# Patient Record
Sex: Female | Born: 1950 | Hispanic: Yes | Smoking: Never smoker
Health system: Southern US, Community
[De-identification: ages and names within clinical notes are randomized; demographics above are authoritative.]

## PROBLEM LIST (undated history)

## (undated) DIAGNOSIS — I10 Essential (primary) hypertension: Secondary | ICD-10-CM

## (undated) DIAGNOSIS — E119 Type 2 diabetes mellitus without complications: Secondary | ICD-10-CM

## (undated) DIAGNOSIS — F32A Depression, unspecified: Secondary | ICD-10-CM

## (undated) DIAGNOSIS — F329 Major depressive disorder, single episode, unspecified: Secondary | ICD-10-CM

---

## 1992-09-24 HISTORY — PX: UMBILICAL HERNIA REPAIR: SHX2598

## 2006-04-09 ENCOUNTER — Emergency Department: Payer: Self-pay | Admitting: Emergency Medicine

## 2007-07-14 ENCOUNTER — Emergency Department: Payer: Self-pay | Admitting: Emergency Medicine

## 2008-05-02 ENCOUNTER — Other Ambulatory Visit: Payer: Self-pay

## 2008-05-02 ENCOUNTER — Observation Stay: Payer: Self-pay | Admitting: Internal Medicine

## 2009-01-26 ENCOUNTER — Emergency Department: Payer: Self-pay | Admitting: Emergency Medicine

## 2009-06-07 ENCOUNTER — Emergency Department: Payer: Self-pay | Admitting: Emergency Medicine

## 2009-07-08 ENCOUNTER — Emergency Department: Payer: Self-pay | Admitting: Unknown Physician Specialty

## 2009-07-18 ENCOUNTER — Ambulatory Visit: Payer: Self-pay

## 2010-10-04 ENCOUNTER — Ambulatory Visit: Payer: Self-pay

## 2010-10-19 ENCOUNTER — Ambulatory Visit: Payer: Self-pay

## 2011-06-21 ENCOUNTER — Emergency Department: Payer: Self-pay | Admitting: Emergency Medicine

## 2011-07-12 ENCOUNTER — Emergency Department: Payer: Self-pay | Admitting: Emergency Medicine

## 2012-04-24 LAB — HM DIABETES EYE EXAM

## 2012-08-23 ENCOUNTER — Emergency Department: Payer: Self-pay | Admitting: Unknown Physician Specialty

## 2012-08-24 LAB — BASIC METABOLIC PANEL
Anion Gap: 6 — ABNORMAL LOW (ref 7–16)
BUN: 14 mg/dL (ref 7–18)
Co2: 29 mmol/L (ref 21–32)
EGFR (African American): >60
EGFR (Non-African Amer.): >60
Glucose: 151 mg/dL — ABNORMAL HIGH (ref 65–99)
Osmolality: 283 (ref 275–301)
Potassium: 3.7 mmol/L (ref 3.5–5.1)
Sodium: 140 mmol/L (ref 136–145)

## 2012-08-24 LAB — CBC
MCH: 30.7 pg (ref 26.0–34.0)
MCHC: 34 g/dL (ref 32.0–36.0)
MCV: 90 fL (ref 80–100)
Platelet: 364 10*3/uL (ref 150–440)
RBC: 4.18 10*6/uL (ref 3.80–5.20)
WBC: 9.8 10*3/uL (ref 3.6–11.0)

## 2012-08-24 LAB — CK TOTAL AND CKMB (NOT AT ARMC)
CK, Total: 77 U/L (ref 21–215)
CK, Total: 88 U/L (ref 21–215)
CK-MB: 0.7 ng/mL (ref 0.5–3.6)

## 2012-08-24 LAB — LIPASE, BLOOD: Lipase: 141 U/L (ref 73–393)

## 2012-08-24 LAB — TROPONIN I
Troponin-I: <0.02 ng/mL
Troponin-I: <0.02 ng/mL

## 2013-01-06 ENCOUNTER — Ambulatory Visit: Payer: Self-pay

## 2013-02-23 ENCOUNTER — Emergency Department: Payer: Self-pay | Admitting: Emergency Medicine

## 2013-02-23 LAB — CBC
HCT: 39.5 % (ref 35.0–47.0)
HGB: 13.2 g/dL (ref 12.0–16.0)
MCH: 29.8 pg (ref 26.0–34.0)
Platelet: 393 10*3/uL (ref 150–440)
RBC: 4.45 10*6/uL (ref 3.80–5.20)
RDW: 14.2 % (ref 11.5–14.5)

## 2013-02-23 LAB — COMPREHENSIVE METABOLIC PANEL
Albumin: 3.6 g/dL (ref 3.4–5.0)
Alkaline Phosphatase: 73 U/L (ref 50–136)
BUN: 14 mg/dL (ref 7–18)
Bilirubin,Total: 0.3 mg/dL (ref 0.2–1.0)
Calcium, Total: 9.1 mg/dL (ref 8.5–10.1)
Chloride: 107 mmol/L (ref 98–107)
Co2: 30 mmol/L (ref 21–32)
EGFR (African American): >60
Glucose: 133 mg/dL — ABNORMAL HIGH (ref 65–99)
Osmolality: 282 (ref 275–301)
SGOT(AST): 19 U/L (ref 15–37)
SGPT (ALT): 20 U/L (ref 12–78)

## 2013-02-23 LAB — URINALYSIS, COMPLETE
Blood: NEGATIVE
Glucose,UR: NEGATIVE mg/dL (ref 0–75)
Ketone: NEGATIVE
Ph: 5 (ref 4.5–8.0)
Protein: NEGATIVE
RBC,UR: 1 /HPF (ref 0–5)
Squamous Epithelial: 3

## 2013-05-07 LAB — HM DIABETES EYE EXAM

## 2014-10-23 ENCOUNTER — Emergency Department (HOSPITAL_COMMUNITY)
Admission: EM | Admit: 2014-10-23 | Discharge: 2014-10-24 | Disposition: A | Discharge status: Home / Self Care | Payer: Self-pay | Attending: Emergency Medicine | Admitting: Emergency Medicine

## 2014-10-23 ENCOUNTER — Encounter (HOSPITAL_COMMUNITY): Payer: Self-pay | Admitting: *Deleted

## 2014-10-23 ENCOUNTER — Emergency Department (HOSPITAL_COMMUNITY): Payer: Self-pay

## 2014-10-23 DIAGNOSIS — R079 Chest pain, unspecified: Secondary | ICD-10-CM | POA: Insufficient documentation

## 2014-10-23 DIAGNOSIS — E119 Type 2 diabetes mellitus without complications: Secondary | ICD-10-CM | POA: Insufficient documentation

## 2014-10-23 DIAGNOSIS — Z7982 Long term (current) use of aspirin: Secondary | ICD-10-CM | POA: Insufficient documentation

## 2014-10-23 DIAGNOSIS — Z79899 Other long term (current) drug therapy: Secondary | ICD-10-CM | POA: Insufficient documentation

## 2014-10-23 DIAGNOSIS — I10 Essential (primary) hypertension: Secondary | ICD-10-CM | POA: Insufficient documentation

## 2014-10-23 HISTORY — DX: Essential (primary) hypertension: I10

## 2014-10-23 HISTORY — DX: Type 2 diabetes mellitus without complications: E11.9

## 2014-10-23 LAB — BASIC METABOLIC PANEL
Anion gap: 5 (ref 5–15)
BUN: 13 mg/dL (ref 6–23)
CHLORIDE: 107 mmol/L (ref 96–112)
CO2: 29 mmol/L (ref 19–32)
CREATININE: 0.78 mg/dL (ref 0.50–1.10)
Calcium: 9.1 mg/dL (ref 8.4–10.5)
GFR calc non Af Amer: 88 mL/min — ABNORMAL LOW (ref >90)
Glucose, Bld: 156 mg/dL — ABNORMAL HIGH (ref 70–99)
POTASSIUM: 3.5 mmol/L (ref 3.5–5.1)
SODIUM: 141 mmol/L (ref 135–145)

## 2014-10-23 LAB — TROPONIN I: Troponin I: <0.03 ng/mL (ref <0.031)

## 2014-10-23 LAB — CBC
HEMATOCRIT: 38.2 % (ref 36.0–46.0)
Hemoglobin: 12.5 g/dL (ref 12.0–15.0)
MCH: 29.3 pg (ref 26.0–34.0)
MCHC: 32.7 g/dL (ref 30.0–36.0)
MCV: 89.5 fL (ref 78.0–100.0)
PLATELETS: 370 10*3/uL (ref 150–400)
RBC: 4.27 MIL/uL (ref 3.87–5.11)
RDW: 14 % (ref 11.5–15.5)
WBC: 6.2 10*3/uL (ref 4.0–10.5)

## 2014-10-23 LAB — BRAIN NATRIURETIC PEPTIDE: B NATRIURETIC PEPTIDE 5: 37.2 pg/mL (ref 0.0–100.0)

## 2014-10-23 MED ORDER — ASPIRIN 81 MG PO CHEW
324.0000 mg | CHEWABLE_TABLET | Freq: Once | ORAL | Status: AC
  Administered 2014-10-23: 324 mg via ORAL
  Filled 2014-10-23: qty 4

## 2014-10-23 MED ORDER — OMEPRAZOLE 20 MG PO CPDR: 20.0000 mg | DELAYED_RELEASE_CAPSULE | Freq: Every day | ORAL | Status: DC

## 2014-10-23 MED ORDER — MORPHINE SULFATE 4 MG/ML IJ SOLN
4.0000 mg | Freq: Once | INTRAMUSCULAR | Status: AC
  Administered 2014-10-23: 4 mg via INTRAVENOUS
  Filled 2014-10-23: qty 1

## 2014-10-23 NOTE — Discharge Instructions (Signed)
Dolor de pecho (no específico) °(Chest Pain (Nonspecific)) °Con frecuencia es difícil dar un diagnóstico específico de la causa del dolor de pecho. Siempre hay una posibilidad de que el dolor podría estar relacionado con algo grave, como un ataque al corazón o un coágulo sanguíneo en los pulmones. Debe someterse a controles con el médico para más evaluaciones. °CAUSAS  °· Acidez. °· Neumonía o bronquitis. °· Ansiedad o estrés. °· Inflamación de la zona que rodea al corazón (pericarditis) o a los pulmones (pleuritis o pleuresía). °· Un coágulo sanguíneo en el pulmón. °· Colapso de un pulmón (neumotórax), que puede aparecer de manera repentina por sí solo (neumotórax espontáneo) o debido a un traumatismo en el tórax. °· Culebrilla (virus del herpes zóster). °La pared torácica está compuesta por huesos, músculos y cartílago. Cualquiera de estos puede ser la fuente del dolor. °· Puede haber una contusión en los huesos debido a una lesión. °· Puede haber un esguince en los músculos o el cartílago ocasionado por la tos o por trabajo excesivo. °· El cartílago puede verse afectado por una inflamación y provocar dolor (costocondritis). °DIAGNÓSTICO  °Quizás se necesiten análisis de laboratorio u otros estudios para encontrar la causa del dolor. Además, puede indicarle que se haga una prueba llamada electrocadiograma (ECG) ambulatorio. El ECG registra los patrones de los latidos cardíacos durante 24 horas. Además, pueden hacerle otros estudios, por ejemplo: °· Ecocardiograma transtorácico (ETT). Durante el ecocardiograma, se usan ondas sonoras para evaluar el flujo de la sangre a través del corazón. °· Ecocardiograma transesofágico (ETE). °· Monitoreo cardíaco. Permite que el médico controle la frecuencia y el ritmo cardíaco en tiempo real. °· Monitor Holter. Es un dispositivo portátil que registra los latidos cardíacos y ayuda a diagnosticar las arritmias cardíacas. Le permite al médico registrar la actividad cardíaca  durante varios días, si es necesario. °· Pruebas de estrés por ejercicio o por medicamentos que aceleran los latidos cardíacos. °TRATAMIENTO  °· El tratamiento depende de la causa del dolor de pecho. El tratamiento puede incluir: °¨ Inhibidores de la acidez estomacal. °¨ Antiinflamatorios. °¨ Analgésicos para las enfermedades inflamatorias. °¨ Antibióticos, si hay una infección. °· Podrán aconsejarle que modifique su estilo de vida. Esto incluye dejar de fumar y evitar el alcohol, la cafeína y el chocolate. °· Pueden aconsejarle que mantenga la cabeza levantada (elevada) cuando duerme. Esto reduce la probabilidad de que el ácido retroceda del estómago al esófago. °En la mayoría de los casos, el dolor de pecho no específico mejorará en el término de 2 a 3 días, con reposo y analgésicos suaves.  °INSTRUCCIONES PARA EL CUIDADO EN EL HOGAR  °· Si le prescriben antibióticos, tómelos tal como se le indicó. Termínelos aunque comience a sentirse mejor. °· Durante los días siguientes, no haga actividades físicas que provoquen dolor de pecho. Continúe con las actividades físicas tal como se le indicó °· No consuma ningún producto que contenga tabaco, incluidos cigarrillos, tabaco de mascar o cigarrillos electrónicos. °· Evite el consumo de alcohol. °· Tome los medicamentos solamente como se lo haya indicado el médico. °· Siga las sugerencias del médico en lo que respecta a las pruebas adicionales, si el dolor de pecho no desaparece. °· Concurra a todas las visitas de control programadas. Si no lo hace, podría desarrollar problemas permanentes (crónicos) relacionados con el dolor. Si hay algún problema para concurrir a una cita, llame para reprogramarla. °SOLICITE ATENCIÓN MÉDICA SI:  °· El dolor de pecho no desaparece, incluso después del tratamiento. °· Tiene una erupción cutánea con ampollas en el   pecho. °· Tiene fiebre. °SOLICITE ATENCIÓN MÉDICA DE INMEDIATO SI:  °· Aumenta el dolor de pecho o este se irradia hacia el  brazo, el cuello, la mandíbula, la espalda o el abdomen. °· Le falta el aire. °· La tos empeora, o expectora sangre. °· Siente dolor intenso en la espalda o el abdomen. °· Se siente nauseoso o vomita. °· Siente debilidad intensa. °· Se desmaya. °· Tiene escalofríos. °Esto es una emergencia. No espere a ver si el dolor se pasa. Obtenga ayuda médica de inmediato. Llame a los servicios de emergencia locales (911 en los [place]). No conduzca por sus propios medios hasta el hospital. °ASEGÚRESE DE QUE:  °· Comprende estas instrucciones. °· Controlará su afección. °· Recibirá ayuda de inmediato si no mejora o si empeora. °Document Released: 09/10/2005 Document Revised: 09/15/2013 °ExitCare® Patient Information ©2015 [employer]. This information is not intended to replace advice given to you by your health care provider. Make sure you discuss any questions you have with your health care provider. ° °

## 2014-10-23 NOTE — ED Provider Notes (Addendum)
CSN: [id number]     Arrival date & time 10/23/14  1855 History   First MD Initiated Contact with Patient 10/23/14 2117      Patient is a 64 y.o. female presenting with chest pain. The history is provided by the patient.  Chest Pain Pain location:  L chest Pain quality: sharp   Pain radiates to:  L arm Pain severity:  Moderate Timing:  Intermittent Progression:  Waxing and waning Chronicity:  New Relieved by:  Nothing Exacerbated by: not worse with exertion or positions. Associated symptoms: no abdominal pain, no fever, no nausea, no shortness of breath and not vomiting   Associated symptoms comment:  She did feel lightheaded and fatigued, she had tingling in her fingers and toes Over the past week she has had the pain.  It has never completely gone away.   It has waxed and waned in intensity but has been persistent She was seen at [facility] for a similar episode and was admitted.  She had some type of test (stress test?).  Did not have a heart cath.  She was told the symptoms were related to stress.  Past Medical History  Diagnosis Date  . Diabetes mellitus without complication   . Hypertension    History reviewed. No pertinent past surgical history. No family history on file. History  Substance Use Topics  . Smoking status: Never Smoker   . Smokeless tobacco: Not on file  . Alcohol Use: No   OB History    No data available     Review of Systems  Constitutional: Negative for fever.  Respiratory: Negative for shortness of breath.   Cardiovascular: Positive for chest pain.  Gastrointestinal: Negative for nausea, vomiting and abdominal pain.  All other systems reviewed and are negative.     Allergies  Review of patient's allergies indicates no known allergies.  Home Medications   Prior to Admission medications   Medication Sig Start Date End Date Taking? Authorizing Provider  aspirin 81 MG tablet Take 81 mg by mouth daily.   Yes Historical Provider, MD   glipiZIDE (GLUCOTROL) 10 MG tablet Take 10 mg by mouth 2 (two) times daily.   Yes Historical Provider, MD  metFORMIN (GLUCOPHAGE) 500 MG tablet Take 500 mg by mouth 2 (two) times daily with a meal.   Yes Historical Provider, MD  metoprolol tartrate (LOPRESSOR) 25 MG tablet Take 25 mg by mouth 2 (two) times daily.   Yes Historical Provider, MD  ranitidine (ZANTAC) 150 MG capsule Take 150 mg by mouth 2 (two) times daily.   Yes Historical Provider, MD  omeprazole (PRILOSEC) 20 MG capsule Take 1 capsule (20 mg total) by mouth daily. 10/23/14   Linwood DibblesJon Maloni Musleh, MD   BP 172/79 mmHg  Pulse 62  Temp(Src) 98 F (36.7 C)  Resp 19  SpO2 97% Physical Exam  Constitutional: She appears well-developed and well-nourished. No distress.  HENT:  Head: Normocephalic and atraumatic.  Right Ear: External ear normal.  Left Ear: External ear normal.  Eyes: Conjunctivae are normal. Right eye exhibits no discharge. Left eye exhibits no discharge. No scleral icterus.  Neck: Neck supple. No tracheal deviation present.  Cardiovascular: Normal rate, regular rhythm and normal heart sounds.   Pulmonary/Chest: Effort normal and breath sounds normal. No stridor. No respiratory distress. She has no wheezes.  Abdominal: Soft. There is no tenderness.  Musculoskeletal: She exhibits no edema.  Neurological: She is alert. Cranial nerve deficit: no gross deficits.  Skin: Skin is warm and  dry. No rash noted.  Psychiatric: She has a normal mood and affect.  Nursing note and vitals reviewed.   ED Course  Procedures (including critical care time) Labs Review Labs Reviewed  BASIC METABOLIC PANEL - Abnormal; Notable for the following:    Glucose, Bld 156 (*)    GFR calc non Af Amer 88 (*)    All other components within normal limits  CBC  BRAIN NATRIURETIC PEPTIDE  TROPONIN I  TROPONIN I    Imaging Review Dg Chest 2 View  10/23/2014   CLINICAL DATA:  Left chest and left arm pain for 1 week which has worsened over the past  2 days.  EXAM: CHEST  2 VIEW  COMPARISON:  None.  FINDINGS: The lungs are clear. Heart size is normal. There is no pneumothorax pleural effusion. No focal bony abnormality.  IMPRESSION: No acute disease.   Electronically Signed   By: Drusilla Kanner M.D.   On: 10/23/2014 20:23     EKG Interpretation   Date/Time:  Saturday October 23 2014 19:02:56 EST Ventricular Rate:  75 PR Interval:  208 QRS Duration: 100 QT Interval:  388 QTC Calculation: 433 R Axis:   7 Text Interpretation:  Normal sinus rhythm Normal ECG No old tracing to  compare Confirmed by Shadaya Marschner  MD-J, Madelein Mahadeo ([id number]) on 10/23/2014 9:39:01 PM      MDM   Final diagnoses:  Chest pain, unspecified chest pain type    Heart Score of 3.   Pt's symptoms however have been persistent for an entire week.  2 sets of cardiac enzymes negative. History of similar symptoms in the past with a reportedly negative stress test. I doubt the symptoms are related to an acute coronary syndrome. I recommend follow-up with her cardiologist. Continue aspirin daily. We'll have her try a course of antacids to see if that offers any symptomatic relief. precautions were discussed.  Linwood Dibbles, MD 10/23/14 0865  Linwood Dibbles, MD 11/15/14 [phone]

## 2014-10-23 NOTE — ED Notes (Signed)
The pt is c/o lt upper chest pain   With lt arm radiation and numbness for 5-6 days.  The pt has rapid respirations..  C/o dizziness sob .  Similar episode 6 years ago.  No cardiac diagnosis

## 2014-11-15 NOTE — Progress Notes (Signed)
Erron  

## 2014-11-16 ENCOUNTER — Encounter: Payer: Self-pay | Admitting: Cardiovascular Disease

## 2014-11-25 ENCOUNTER — Telehealth: Payer: Self-pay | Admitting: Cardiovascular Disease

## 2014-11-25 ENCOUNTER — Encounter: Payer: Self-pay | Admitting: Cardiovascular Disease

## 2014-11-25 NOTE — Telephone Encounter (Signed)
ERROR

## 2014-12-26 ENCOUNTER — Emergency Department
Admit: 2014-12-26 | Disposition: A | Discharge status: Home / Self Care | Payer: Self-pay | Admitting: Physician Assistant

## 2015-03-01 ENCOUNTER — Ambulatory Visit: Payer: Self-pay

## 2015-07-05 ENCOUNTER — Ambulatory Visit: Payer: Self-pay

## 2015-07-07 ENCOUNTER — Ambulatory Visit: Payer: Self-pay | Admitting: Ophthalmology

## 2015-07-07 LAB — HM DIABETES EYE EXAM

## 2015-08-09 ENCOUNTER — Other Ambulatory Visit: Payer: Self-pay

## 2015-08-16 ENCOUNTER — Ambulatory Visit: Payer: Self-pay

## 2015-08-23 ENCOUNTER — Ambulatory Visit: Payer: Self-pay

## 2015-08-23 LAB — MICROALBUMIN, URINE: Microalb, Ur: 3

## 2015-11-07 ENCOUNTER — Emergency Department: Payer: Self-pay

## 2015-11-07 ENCOUNTER — Encounter: Payer: Self-pay | Admitting: Urgent Care

## 2015-11-07 ENCOUNTER — Emergency Department
Admission: EM | Admit: 2015-11-07 | Discharge: 2015-11-07 | Disposition: A | Discharge status: Home / Self Care | Payer: Self-pay | Attending: Emergency Medicine | Admitting: Emergency Medicine

## 2015-11-07 DIAGNOSIS — H9203 Otalgia, bilateral: Secondary | ICD-10-CM | POA: Insufficient documentation

## 2015-11-07 DIAGNOSIS — Z7982 Long term (current) use of aspirin: Secondary | ICD-10-CM | POA: Insufficient documentation

## 2015-11-07 DIAGNOSIS — Z79899 Other long term (current) drug therapy: Secondary | ICD-10-CM | POA: Insufficient documentation

## 2015-11-07 DIAGNOSIS — I10 Essential (primary) hypertension: Secondary | ICD-10-CM | POA: Insufficient documentation

## 2015-11-07 DIAGNOSIS — Z7984 Long term (current) use of oral hypoglycemic drugs: Secondary | ICD-10-CM | POA: Insufficient documentation

## 2015-11-07 DIAGNOSIS — E119 Type 2 diabetes mellitus without complications: Secondary | ICD-10-CM | POA: Insufficient documentation

## 2015-11-07 DIAGNOSIS — J069 Acute upper respiratory infection, unspecified: Secondary | ICD-10-CM | POA: Insufficient documentation

## 2015-11-07 MED ORDER — PSEUDOEPH-BROMPHEN-DM 30-2-10 MG/5ML PO SYRP: 10.0000 mL | ORAL_SOLUTION | Freq: Four times a day (QID) | ORAL | Status: DC | PRN

## 2015-11-07 MED ORDER — FLUTICASONE PROPIONATE 50 MCG/ACT NA SUSP: 2.0000 | Freq: Every day | NASAL | Status: DC

## 2015-11-07 NOTE — ED Provider Notes (Signed)
[facility] Emergency Department Provider Note  ____________________________________________  Time seen: Approximately 9:45 PM  I have reviewed the triage vital signs and the nursing notes.   HISTORY  Chief Complaint Cough; Sore Throat; and Otalgia    HPI Kelly Rose is a 65 y.o. female, NAD, sensory emergency department with 3 week history of cough, sore throat, ear pain, nasal congestion. Denies any wheezing, chest congestion, chest pain, back pain. No fevers, chills, body aches. Has not taken anything over-the-counter for current symptoms. No known sick contacts.   Past Medical History  Diagnosis Date  . Diabetes mellitus without complication (HCC)   . Hypertension     There are no active problems to display for this patient.   History reviewed. No pertinent past surgical history.  Current Outpatient Rx  Name  Route  Sig  Dispense  Refill  . aspirin 81 MG tablet   Oral   Take 81 mg by mouth daily.         . brompheniramine-pseudoephedrine-DM 30-2-10 MG/5ML syrup   Oral   Take 10 mLs by mouth 4 (four) times daily as needed.   200 mL   0   . fluticasone (FLONASE) 50 MCG/ACT nasal spray   Each Nare   Place 2 sprays into both nostrils daily.   16 g   0   . glipiZIDE (GLUCOTROL) 10 MG tablet   Oral   Take 10 mg by mouth 2 (two) times daily.         Marland Kitchen lisinopril (PRINIVIL,ZESTRIL) 20 MG tablet   Oral   Take 20 mg by mouth daily.         . metFORMIN (GLUCOPHAGE) 500 MG tablet   Oral   Take 500 mg by mouth 2 (two) times daily with a meal.         . metoprolol tartrate (LOPRESSOR) 25 MG tablet   Oral   Take 25 mg by mouth 2 (two) times daily.         Marland Kitchen omeprazole (PRILOSEC) 20 MG capsule   Oral   Take 1 capsule (20 mg total) by mouth daily.   30 capsule   0   . ranitidine (ZANTAC) 150 MG capsule   Oral   Take 150 mg by mouth 2 (two) times daily.         . simvastatin (ZOCOR) 20 MG tablet   Oral   Take 20 mg by  mouth daily.           Allergies Review of patient's allergies indicates no known allergies.  No family history on file.  Social History Social History  Substance Use Topics  . Smoking status: Never Smoker   . Smokeless tobacco: None  . Alcohol Use: No     Review of Systems  Constitutional: No fever/chills, fatigue.  Eyes:  No discharge ENT: Positive sore throat, bilateral ear pain. No nasal congestion, drainage, sinus pressure Cardiovascular: No chest pain. Respiratory: Negative cough. No shortness of breath. No wheezing. No chest congestion Musculoskeletal: Negative for back pain and general myalgias.  Skin: Negative for rash. Neurological: Negative for headaches, focal weakness or numbness. 10-point ROS otherwise negative.  ____________________________________________   PHYSICAL EXAM:  VITAL SIGNS: ED Triage Vitals  Enc Vitals Group     BP 11/07/15 2024 168/71 mmHg     Pulse Rate 11/07/15 2024 71     Resp 11/07/15 2024 18     Temp 11/07/15 2024 98 F (36.7 C)  Temp Source 11/07/15 2024 Oral     SpO2 11/07/15 2024 97 %     Weight 11/07/15 2024 240 lb (108.863 kg)     Height 11/07/15 2024  (1.702 m)     Head Cir --      Peak Flow --      Pain Score 11/07/15 2025 0     Pain Loc --      Pain Edu? --      Excl. in GC? --     Constitutional: Alert and oriented. Well appearing and in no acute distress. Eyes: Conjunctivae are normal. PERRL. EOMI without pain.  Head: Atraumatic. ENT:      Ears: Bilateral TMs visualized without perforation, erythema, bulging. Mild serous effusions noted bilaterally.      Nose: Moderate congestion with trace rhinnorhea.      Mouth/Throat: Mucous membranes are moist. Pharynx without erythema, swelling, exudates Neck: Supple with full range of motion Hematological/Lymphatic/Immunilogical: No cervical lymphadenopathy. Cardiovascular: Normal rate, regular rhythm. Normal S1 and S2.  Good peripheral  circulation. Respiratory: Normal respiratory effort without tachypnea or retractions. Lungs CTAB. Neurologic:  Normal speech and language. No gross focal neurologic deficits are appreciated.  Skin:  Skin is warm, dry and intact. No rash noted. Psychiatric: Mood and affect are normal. Speech and behavior are normal. Patient exhibits appropriate insight and judgement.   ____________________________________________   LABS  None  ____________________________________________  EKG  None ____________________________________________  RADIOLOGY I have personally viewed and evaluated these images (plain radiographs) as part of my medical decision making, as well as reviewing the written report by the radiologist.  Dg Chest 2 View  11/07/2015  CLINICAL DATA:  Patient has had a productive cough for 3 weeks. She has had weakness, headache, ear ache and sore throat Hx htn, diabetes EXAM: CHEST  2 VIEW COMPARISON:  10/23/2014 FINDINGS: Normal heart size and pulmonary vascularity. No focal airspace disease or consolidation in the lungs. No blunting of costophrenic angles. No pneumothorax. Mediastinal contours appear intact. Calcification of the aorta. Degenerative changes in the spine. IMPRESSION: No active cardiopulmonary disease. Electronically Signed   By: Burman Nieves M.D.   On: 11/07/2015 21:13    ____________________________________________    PROCEDURES  Procedure(s) performed: None   Medications - No data to display   ____________________________________________   INITIAL IMPRESSION / ASSESSMENT AND PLAN / ED COURSE  Patient's diagnosis is consistent with viral upper respiratory infection with lingering eustachian tube dysfunction. Patient will be discharged home with prescriptions for Flonase nasal spray and promethazine DM cough syrup to use as to rectal. Patient is to follow up with her care physician if symptoms persist past this treatment course. Patient is given ED  precautions to return to the ED for any worsening or new symptoms.    ____________________________________________  FINAL CLINICAL IMPRESSION(S) / ED DIAGNOSES  Final diagnoses:  Upper respiratory infection, viral      NEW MEDICATIONS STARTED DURING THIS VISIT:  New Prescriptions   BROMPHENIRAMINE-PSEUDOEPHEDRINE-DM 30-2-10 MG/5ML SYRUP    Take 10 mLs by mouth 4 (four) times daily as needed.   FLUTICASONE (FLONASE) 50 MCG/ACT NASAL SPRAY    Place 2 sprays into both nostrils daily.         Hope Pigeon, PA-C 11/07/15 2151  Sharman Cheek, MD 11/07/15 2253

## 2015-11-07 NOTE — Discharge Instructions (Signed)
Infección del tracto respiratorio superior, adultos  (Upper Respiratory Infection, Adult)  La mayoría de las infecciones del tracto respiratorio superior son infecciones virales de las vías que llevan el aire a los pulmones. Un infección del tracto respiratorio superior afecta la nariz, la garganta y las vías respiratorias superiores. El tipo más frecuente de infección del tracto respiratorio superior es la nasofaringitis, que habitualmente se conoce como "resfrío común".  Las infecciones del tracto respiratorio superior siguen su curso y por lo general se curan solas. En la mayoría de los casos, la infección del tracto respiratorio superior no requiere atención médica, pero a veces, después de una infección viral, puede surgir una infección bacteriana en las vías respiratorias superiores. Esto se conoce como infección secundaria. Las infecciones sinusales y en el oído medio son tipos frecuentes de infecciones secundarias en el tracto respiratorio superior.  La neumonía bacteriana también puede complicar un cuadro de infección del tracto respiratorio superior. Este tipo de infección puede empeorar el asma y la enfermedad pulmonar obstructiva crónica (EPOC). En algunos casos, estas complicaciones pueden requerir atención médica de emergencia y poner en peligro la vida.   CAUSAS  Casi todas las infecciones del tracto respiratorio superior se deben a los virus. Un virus es un tipo de microbio que puede contagiarse de una persona a otra.   FACTORES DE RIESGO  Puede estar en riesgo de sufrir una infección del tracto respiratorio superior si:   · Fuma.  · Tiene una enfermedad pulmonar o cardíaca crónica.  · Tiene debilitado el sistema de defensa (inmunitario) del cuerpo.  · Es muy joven o de edad muy avanzada.  · Tiene asma o alergias nasales.  · Trabaja en áreas donde hay mucha gente o poca ventilación.  · Trabaja en una escuela o en un centro de atención médica.  SIGNOS Y SÍNTOMAS   Habitualmente, los síntomas aparecen  de 2 a 3 días después de entrar en contacto con el virus del resfrío. La mayoría de las infecciones virales en el tracto respiratorio superior duran de 7 a 10 días. Sin embargo, las infecciones virales en el tracto respiratorio superior a causa del virus de la gripe pueden durar de 14 a 18 días y, habitualmente, son más graves. Entre los síntomas se pueden incluir los siguientes:   · Secreción o congestión nasal.  · Estornudos.  · Tos.  · Dolor de garganta.  · Dolor de cabeza.  · Fatiga.  · Fiebre.  · Pérdida del apetito.  · Dolor en la frente, detrás de los ojos y por encima de los pómulos (dolor sinusal).  · Dolores musculares.  DIAGNÓSTICO   El médico puede diagnosticar una infección del tracto respiratorio superior mediante los siguientes estudios:  · Examen físico.  · Pruebas para verificar si los síntomas no se deben a otra afección, por ejemplo:    Faringitis estreptocócica.    Sinusitis.    Neumonía.    Asma.  TRATAMIENTO   Esta infección desaparece sola, con el tiempo. No puede curarse con medicamentos, pero a menudo se prescriben para aliviar los síntomas. Los medicamentos pueden ser útiles para lo siguiente:  · Bajar la fiebre.  · Reducir la tos.  · Aliviar la congestión nasal.  INSTRUCCIONES PARA EL CUIDADO EN EL HOGAR   · Tome los medicamentos solamente como se lo haya indicado el médico.  · A fin de aliviar el dolor de garganta, haga gárgaras con solución salina templada o consuma caramelos para la tos, como se lo haya indicado el médico.  · Use un humidificador   de vapor cálido o inhale el vapor de la ducha para aumentar la humedad del aire. Esto facilitará la respiración.  · Beba suficiente líquido para mantener la orina clara o de color amarillo pálido.  · Consuma sopas y otros caldos transparentes, y aliméntese bien.  · Descanse todo lo que sea necesario.  · Regrese al trabajo cuando la temperatura se le haya normalizado o cuando el médico lo autorice. Es posible que deba quedarse en su casa durante  un tiempo prolongado, para no infectar a los demás. También puede usar un barbijo y lavarse las manos con cuidado para evitar la propagación del virus.  · Aumente el uso del inhalador si tiene asma.  · No consuma ningún producto que contenga tabaco, lo que incluye cigarrillos, tabaco de mascar o cigarrillos electrónicos. Si necesita ayuda para dejar de fumar, consulte al médico.  PREVENCIÓN   La mejor manera de protegerse de un resfrío es mantener una higiene adecuada.   · Evite el contacto oral o físico con personas que tengan síntomas de resfrío.  · En caso de contacto, lávese las manos con frecuencia.  No hay pruebas claras de que la vitamina C, la vitamina E, la equinácea o el ejercicio reduzcan la probabilidad de contraer un resfrío. Sin embargo, siempre se recomienda descansar mucho, hacer ejercicio y alimentarse bien.   SOLICITE ATENCIÓN MÉDICA SI:   · Su estado empeora en lugar de mejorar.  · Los medicamentos no logran controlar los síntomas.  · Tiene escalofríos.  · La sensación de falta de aire empeora.  · Tiene mucosidad marrón o roja.  · Tiene secreción nasal amarilla o marrón.  · Le duele la cara, especialmente al inclinarse hacia adelante.  · Tiene fiebre.  · Tiene los ganglios del cuello hinchados.  · Siente dolor al tragar.  · Tiene zonas blancas en la parte de atrás de la garganta.  SOLICITE ATENCIÓN MÉDICA DE INMEDIATO SI:   · Tiene síntomas intensos o persistentes de:    Dolor de cabeza.    Dolor de oídos.    Dolor sinusal.    Dolor en el pecho.  · Tiene enfermedad pulmonar crónica y cualquiera de estos síntomas:    Sibilancias.    Tos prolongada.    Tos con sangre.    Cambio en la mucosidad habitual.  · Presenta rigidez en el cuello.  · Tiene cambios en:    La visión.    La audición.    El pensamiento.    El estado de ánimo.  ASEGÚRESE DE QUE:   · Comprende estas instrucciones.  · Controlará su afección.  · Recibirá ayuda de inmediato si no mejora o si empeora.     Esta información no tiene como  fin reemplazar el consejo del médico. Asegúrese de hacerle al médico cualquier pregunta que tenga.     Document Released: 06/20/2005 Document Revised: 01/25/2015  Elsevier Interactive Patient Education ©2016 [employer]

## 2015-11-07 NOTE — ED Notes (Signed)
Patient presents with a 3 week history of cough, sore throat, and bilateral ear pain. (+) nausea associated with cough. Denies fever.

## 2015-11-15 ENCOUNTER — Other Ambulatory Visit: Payer: Self-pay

## 2015-11-22 ENCOUNTER — Ambulatory Visit: Payer: Self-pay

## 2015-11-22 ENCOUNTER — Ambulatory Visit: Payer: Self-pay | Admitting: Family Medicine

## 2015-11-22 VITALS — BP 158/76 | HR 76 | Resp 16 | Wt 224.0 lb

## 2015-11-22 DIAGNOSIS — E119 Type 2 diabetes mellitus without complications: Secondary | ICD-10-CM | POA: Insufficient documentation

## 2015-11-22 DIAGNOSIS — I10 Essential (primary) hypertension: Secondary | ICD-10-CM

## 2015-11-22 DIAGNOSIS — E7800 Pure hypercholesterolemia, unspecified: Secondary | ICD-10-CM

## 2015-11-22 DIAGNOSIS — J019 Acute sinusitis, unspecified: Secondary | ICD-10-CM

## 2015-11-22 DIAGNOSIS — K219 Gastro-esophageal reflux disease without esophagitis: Secondary | ICD-10-CM

## 2015-11-22 MED ORDER — AMOXICILLIN-POT CLAVULANATE 875-125 MG PO TABS: 1.0000 | ORAL_TABLET | Freq: Two times a day (BID) | ORAL | Status: DC

## 2015-11-22 MED ORDER — LOSARTAN POTASSIUM 50 MG PO TABS: 50.0000 mg | ORAL_TABLET | Freq: Every day | ORAL | Status: DC

## 2015-11-22 NOTE — Progress Notes (Signed)
Patient: Kelly Rose Female    DOB: [birth year]   65 y.o.   MRN: [id number] Visit Date: 11/22/2015  Today's Provider: [facility]   Chief Complaint  Patient presents with  . Diabetes  . Hypertension  . Hyperlipidemia   Subjective:    URI  This is a new problem. The current episode started 1 to 4 weeks ago. The problem has been unchanged. Associated symptoms include abdominal pain (Left sided that radiates to her back.), congestion, coughing, headaches, rhinorrhea and wheezing. Pertinent negatives include no diarrhea, ear pain, nausea, neck pain or vomiting.    Pt was seen in the ER 11/07/2015 and was giving a cough medication, and Flonase.  She reports still having sinus congestion/pain.   Diabetes Mellitus Type II, Follow-up:   No results found for: HGBA1C Last seen for diabetes 3 months ago.  She reports excellent compliance with treatment. She is not having side effects.  Current symptoms include none and have been stable. Home blood sugar records: 120's  Episodes of hypoglycemia? no  Current diet: in general, a "healthy" diet     ------------------------------------------------------------------------   Hypertension, follow-up:  BP Readings from Last 3 Encounters:  11/22/15 158/76  11/07/15 168/71  08/23/15 141/71     She reports excellent compliance with treatment. She reports having to stop Lisinopril secondary to side effects.  She is exercising. She is adherent to low salt diet.   She is experiencing none.  Patient denies chest pain, fatigue and palpitations.   Cardiovascular risk factors include advanced age (older than 75 for men, 49 for women), diabetes mellitus and hypertension.   ------------------------------------------------------------------------    Lipid/Cholesterol, Follow-up:   Last Lipid Panel: No results found for: CHOL, TRIG, HDL, CHOLHDL, VLDL, LDLCALC, LDLDIRECT  She reports excellent compliance with  treatment. She is not having side effects.   Wt Readings from Last 3 Encounters:  11/22/15 224 lb (101.606 kg)  11/07/15 240 lb (108.863 kg)  08/23/15 244 lb (110.678 kg)    ------------------------------------------------------------------------        No Known Allergies Previous Medications   ASPIRIN 81 MG TABLET    Take 81 mg by mouth daily.   BROMPHENIRAMINE-PSEUDOEPHEDRINE-DM 30-2-10 MG/5ML SYRUP    Take 10 mLs by mouth 4 (four) times daily as needed.   FLUTICASONE (FLONASE) 50 MCG/ACT NASAL SPRAY    Place 2 sprays into both nostrils daily.   GLIPIZIDE (GLUCOTROL) 10 MG TABLET    Take 10 mg by mouth 2 (two) times daily.   LISINOPRIL (PRINIVIL,ZESTRIL) 20 MG TABLET    Take 20 mg by mouth daily. Reported on 11/22/2015   METFORMIN (GLUCOPHAGE) 500 MG TABLET    Take 500 mg by mouth 2 (two) times daily with a meal.   METOPROLOL TARTRATE (LOPRESSOR) 25 MG TABLET    Take 25 mg by mouth 2 (two) times daily.   OMEPRAZOLE (PRILOSEC) 20 MG CAPSULE    Take 1 capsule (20 mg total) by mouth daily.   RANITIDINE (ZANTAC) 150 MG CAPSULE    Take 150 mg by mouth 2 (two) times daily.   SIMVASTATIN (ZOCOR) 20 MG TABLET    Take 20 mg by mouth daily.    Review of Systems  Constitutional: Negative.   HENT: Positive for congestion, postnasal drip, rhinorrhea and sinus pressure. Negative for ear discharge, ear pain, nosebleeds and voice change.   Respiratory: Positive for cough, chest tightness and wheezing. Negative for apnea, choking, shortness of breath and  stridor.   Cardiovascular: Negative.   Gastrointestinal: Positive for abdominal pain (Left sided that radiates to her back.). Negative for nausea, vomiting, diarrhea, constipation, blood in stool, abdominal distention, anal bleeding and rectal pain.  Endocrine: Negative.   Musculoskeletal: Positive for back pain. Negative for myalgias, joint swelling, arthralgias, gait problem, neck pain and neck stiffness.  Neurological: Positive for  headaches. Negative for dizziness and light-headedness.    Social History  Substance Use Topics  . Smoking status: Never Smoker   . Smokeless tobacco: Never Used  . Alcohol Use: Yes     Comment: Only socially   Objective:   BP 158/76 mmHg  Pulse 76  Resp 16  Wt 224 lb (101.606 kg)  Physical Exam  Constitutional: She appears well-developed and well-nourished.  HENT:  Head: Normocephalic and atraumatic.  Right Ear: Tympanic membrane, external ear and ear canal normal.  Left Ear: Tympanic membrane, external ear and ear canal normal.  Nose: Mucosal edema present.  Mouth/Throat: Uvula is midline, oropharynx is clear and moist and mucous membranes are normal.  Cardiovascular: Normal rate, regular rhythm and normal heart sounds.   Pulmonary/Chest: Effort normal and breath sounds normal.  Psychiatric: She has a normal mood and affect. Her behavior is normal. Judgment and thought content normal.        Assessment & Plan:     1. Type 2 diabetes mellitus without complication, without long-term current use of insulin (HCC) A1C stable at 6.9%.  Recheck in 3 Months.  2. Essential hypertension BP is elevated, she did not tolerate Lisinopril.  Will try Losartan.   - losartan (COZAAR) 50 MG tablet; Take 1 tablet (50 mg total) by mouth daily.  Dispense: 90 tablet; Refill: 1  3. Hypercholesteremia Stable, continue current meds.   4. Acute sinusitis, recurrence not specified, unspecified location New problem.  Will treat.  - amoxicillin-clavulanate (AUGMENTIN) 875-125 MG tablet; Take 1 tablet by mouth 2 (two) times daily.  Dispense: 20 tablet; Refill: 0     Patient was seen and examined by Leo Grosser, MD, and note scribed by Kavin Leech, CMA.  I have reviewed the document for accuracy and completeness and I agree with above. - Leo Grosser, MD   [facility]  [facility]

## 2015-11-23 DIAGNOSIS — K219 Gastro-esophageal reflux disease without esophagitis: Secondary | ICD-10-CM | POA: Insufficient documentation

## 2015-11-24 ENCOUNTER — Other Ambulatory Visit: Payer: Self-pay

## 2015-11-24 ENCOUNTER — Telehealth: Payer: Self-pay

## 2015-11-24 NOTE — Telephone Encounter (Signed)
Patent's daughter, Windell Moulding. Called. Patient's  prescriptions were not received at [facility]  Please call patient at [phone] or [phone] to advise.

## 2015-11-25 ENCOUNTER — Telehealth: Payer: Self-pay

## 2015-11-25 ENCOUNTER — Other Ambulatory Visit: Payer: Self-pay

## 2015-11-25 NOTE — Telephone Encounter (Signed)
Patient's daughter, Windell MouldingRuth , called medication not received at [facility].

## 2015-11-25 NOTE — Telephone Encounter (Signed)
Rx called to [facility] , patient adivised. JGG, RMA

## 2016-01-05 ENCOUNTER — Ambulatory Visit: Payer: Self-pay | Admitting: Ophthalmology

## 2016-02-14 ENCOUNTER — Other Ambulatory Visit: Payer: Self-pay

## 2016-02-16 ENCOUNTER — Emergency Department
Admission: EM | Admit: 2016-02-16 | Discharge: 2016-02-16 | Disposition: A | Discharge status: Home / Self Care | Payer: Self-pay | Attending: Emergency Medicine | Admitting: Emergency Medicine

## 2016-02-16 ENCOUNTER — Encounter: Payer: Self-pay | Admitting: Emergency Medicine

## 2016-02-16 DIAGNOSIS — Z7982 Long term (current) use of aspirin: Secondary | ICD-10-CM | POA: Insufficient documentation

## 2016-02-16 DIAGNOSIS — I10 Essential (primary) hypertension: Secondary | ICD-10-CM | POA: Insufficient documentation

## 2016-02-16 DIAGNOSIS — E119 Type 2 diabetes mellitus without complications: Secondary | ICD-10-CM | POA: Insufficient documentation

## 2016-02-16 DIAGNOSIS — R0789 Other chest pain: Secondary | ICD-10-CM | POA: Insufficient documentation

## 2016-02-16 DIAGNOSIS — Z7984 Long term (current) use of oral hypoglycemic drugs: Secondary | ICD-10-CM | POA: Insufficient documentation

## 2016-02-16 DIAGNOSIS — R079 Chest pain, unspecified: Secondary | ICD-10-CM

## 2016-02-16 DIAGNOSIS — Z79899 Other long term (current) drug therapy: Secondary | ICD-10-CM | POA: Insufficient documentation

## 2016-02-16 LAB — BASIC METABOLIC PANEL
Anion gap: 6 (ref 5–15)
BUN: 13 mg/dL (ref 6–20)
CHLORIDE: 103 mmol/L (ref 101–111)
CO2: 31 mmol/L (ref 22–32)
CREATININE: 0.61 mg/dL (ref 0.44–1.00)
Calcium: 9.5 mg/dL (ref 8.9–10.3)
Glucose, Bld: 157 mg/dL — ABNORMAL HIGH (ref 65–99)
POTASSIUM: 3.8 mmol/L (ref 3.5–5.1)
SODIUM: 140 mmol/L (ref 135–145)

## 2016-02-16 LAB — CBC WITH DIFFERENTIAL/PLATELET
BASOS ABS: 0.1 10*3/uL (ref 0–0.1)
EOS ABS: 0.2 10*3/uL (ref 0–0.7)
HCT: 38.5 % (ref 35.0–47.0)
HEMOGLOBIN: 12.8 g/dL (ref 12.0–16.0)
Lymphs Abs: 2.5 10*3/uL (ref 1.0–3.6)
MCH: 29.6 pg (ref 26.0–34.0)
MCHC: 33.3 g/dL (ref 32.0–36.0)
MCV: 89 fL (ref 80.0–100.0)
Monocytes Absolute: 0.7 10*3/uL (ref 0.2–0.9)
Monocytes Relative: 8 %
NEUTROS ABS: 5.2 10*3/uL (ref 1.4–6.5)
PLATELETS: 378 10*3/uL (ref 150–440)
RBC: 4.33 MIL/uL (ref 3.80–5.20)
RDW: 15.1 % — ABNORMAL HIGH (ref 11.5–14.5)
WBC: 8.7 10*3/uL (ref 3.6–11.0)

## 2016-02-16 LAB — URINALYSIS COMPLETE WITH MICROSCOPIC (ARMC ONLY)
BILIRUBIN URINE: NEGATIVE
Bacteria, UA: NONE SEEN
Glucose, UA: NEGATIVE mg/dL
Hgb urine dipstick: NEGATIVE
KETONES UR: NEGATIVE mg/dL
LEUKOCYTES UA: NEGATIVE
Nitrite: NEGATIVE
PROTEIN: NEGATIVE mg/dL
SPECIFIC GRAVITY, URINE: 1.004 — AB (ref 1.005–1.030)
pH: 6 (ref 5.0–8.0)

## 2016-02-16 LAB — TROPONIN I: Troponin I: <0.03 ng/mL (ref <0.031)

## 2016-02-16 MED ORDER — ONDANSETRON HCL 4 MG/2ML IJ SOLN
4.0000 mg | Freq: Once | INTRAMUSCULAR | Status: AC
  Administered 2016-02-16: 4 mg via INTRAVENOUS
  Filled 2016-02-16: qty 2

## 2016-02-16 MED ORDER — NITROGLYCERIN 0.4 MG SL SUBL
0.4000 mg | SUBLINGUAL_TABLET | SUBLINGUAL | Status: DC | PRN
  Filled 2016-02-16: qty 1

## 2016-02-16 MED ORDER — GI COCKTAIL ~~LOC~~
ORAL | Status: AC
  Administered 2016-02-16: 09:00:00
  Filled 2016-02-16: qty 30

## 2016-02-16 MED ORDER — ACETAMINOPHEN 500 MG PO TABS
1000.0000 mg | ORAL_TABLET | Freq: Once | ORAL | Status: AC
  Administered 2016-02-16: 1000 mg via ORAL
  Filled 2016-02-16: qty 2

## 2016-02-16 NOTE — ED Provider Notes (Signed)
[facility] Emergency Department Provider Note    ____________________________________________  Time seen: ~0835  I have reviewed the triage vital signs and the nursing notes.   HISTORY  Chief Complaint Tachycardia   History limited by: Not Limited   HPI Kelly Rose is a 65 y.o. female who presents to the emergency department today because of concern for chest pressure, palpitations and feeling unwell. The patient woke up at 3 AM with these symptoms. She describes the chest pain as being located in the left chest. It is pressure-like. It has been constant since it started. She rates it a 9 out of 10. She has had associated tingling in her left hand and the sense of palpitations. Furthermore she states she has a headache. She felt normal going to bed last night. Yesterday was normal today. Says she had similar symptoms roughly 10 years ago.   Past Medical History  Diagnosis Date  . Diabetes mellitus without complication (HCC)   . Hypertension     Patient Active Problem List   Diagnosis Date Noted  . GERD (gastroesophageal reflux disease) 11/23/2015  . Diabetes (HCC) 11/22/2015  . Hypertension 11/22/2015  . Hypercholesteremia 11/22/2015    History reviewed. No pertinent past surgical history.  Current Outpatient Rx  Name  Route  Sig  Dispense  Refill  . amoxicillin-clavulanate (AUGMENTIN) 875-125 MG tablet   Oral   Take 1 tablet by mouth 2 (two) times daily.   20 tablet   0   . aspirin 81 MG tablet   Oral   Take 81 mg by mouth daily.         . brompheniramine-pseudoephedrine-DM 30-2-10 MG/5ML syrup   Oral   Take 10 mLs by mouth 4 (four) times daily as needed.   200 mL   0   . fluticasone (FLONASE) 50 MCG/ACT nasal spray   Each Nare   Place 2 sprays into both nostrils daily.   16 g   0   . glipiZIDE (GLUCOTROL) 10 MG tablet   Oral   Take 10 mg by mouth 2 (two) times daily.         Marland Kitchen lisinopril (PRINIVIL,ZESTRIL) 20 MG  tablet   Oral   Take 20 mg by mouth daily. Reported on 11/22/2015         . losartan (COZAAR) 50 MG tablet   Oral   Take 1 tablet (50 mg total) by mouth daily.   90 tablet   1   . metFORMIN (GLUCOPHAGE) 500 MG tablet   Oral   Take 500 mg by mouth 2 (two) times daily with a meal.         . metoprolol tartrate (LOPRESSOR) 25 MG tablet   Oral   Take 25 mg by mouth 2 (two) times daily.         Marland Kitchen omeprazole (PRILOSEC) 20 MG capsule   Oral   Take 1 capsule (20 mg total) by mouth daily.   30 capsule   0   . ranitidine (ZANTAC) 150 MG capsule   Oral   Take 150 mg by mouth 2 (two) times daily.         . simvastatin (ZOCOR) 20 MG tablet   Oral   Take 20 mg by mouth daily.           Allergies Lisinopril  History reviewed. No pertinent family history.  Social History Social History  Substance Use Topics  . Smoking status: Never Smoker   . Smokeless tobacco:  Never Used  . Alcohol Use: Yes     Comment: Only socially    Review of Systems  Constitutional: Negative for fever. Cardiovascular:Positive for chest pain. Respiratory: Negative for shortness of breath. Gastrointestinal: Negative for abdominal pain, vomiting and diarrhea. Neurological: Negative for headaches, focal weakness or numbness.   10-point ROS otherwise negative.  ____________________________________________   PHYSICAL EXAM:  VITAL SIGNS: ED Triage Vitals  Enc Vitals Group     BP 02/16/16 0841 151/73 mmHg     Pulse Rate 02/16/16 0841 72     Resp 02/16/16 0835 16     Temp --      Temp src --      SpO2 02/16/16 0841 100 %     Weight --      Height --      Head Cir --      Peak Flow --      Pain Score 02/16/16 0835 8   Constitutional: Alert and oriented. Well appearing and in no distress. Eyes: Conjunctivae are normal. PERRL. Normal extraocular movements. ENT   Head: Normocephalic and atraumatic.   Nose: No congestion/rhinnorhea.   Mouth/Throat: Mucous membranes are  moist.   Neck: No stridor. Hematological/Lymphatic/Immunilogical: No cervical lymphadenopathy. Cardiovascular: Normal rate, regular rhythm.  No murmurs, rubs, or gallops. Respiratory: Normal respiratory effort without tachypnea nor retractions. Breath sounds are clear and equal bilaterally. No wheezes/rales/rhonchi. Gastrointestinal: Soft and nontender. No distention. There is no CVA tenderness. Genitourinary: Deferred Musculoskeletal: Normal range of motion in all extremities. No joint effusions.  No lower extremity tenderness nor edema. Neurologic:  Normal speech and language. No gross focal neurologic deficits are appreciated.  Skin:  Skin is warm, dry and intact. No rash noted. Psychiatric: Mood and affect are normal. Speech and behavior are normal. Patient exhibits appropriate insight and judgment.  ____________________________________________    LABS (pertinent positives/negatives)  Labs Reviewed  CBC WITH DIFFERENTIAL/PLATELET - Abnormal; Notable for the following:    RDW 15.1 (*)    All other components within normal limits  BASIC METABOLIC PANEL - Abnormal; Notable for the following:    Glucose, Bld 157 (*)    All other components within normal limits  URINALYSIS COMPLETEWITH MICROSCOPIC ([facility] ONLY) - Abnormal; Notable for the following:    Color, Urine STRAW (*)    APPearance CLEAR (*)    Specific Gravity, Urine 1.004 (*)    Squamous Epithelial / LPF 0-5 (*)    All other components within normal limits  TROPONIN I  TROPONIN I     ____________________________________________   EKG  I, Phineas Semen, attending physician, personally viewed and interpreted this EKG  EKG Time: 0838 Rate: 71 Rhythm: normal sinus rhythm with 1st degree av block Axis: normal Intervals: qtc 454 QRS: narrow ST changes: no st elevation Impression: abnormal ekg   ____________________________________________     RADIOLOGY  None  ____________________________________________   PROCEDURES  Procedure(s) performed: None  Critical Care performed: No  ____________________________________________   INITIAL IMPRESSION / ASSESSMENT AND PLAN / ED COURSE  Pertinent labs & imaging results that were available during my care of the patient were reviewed by me and considered in my medical decision making (see chart for details).  Patient presents to the emergency department today with concern for chest pain. Patient has history of hypertension, diabetes. EKG without any concerning findings. Will check blood work, will additionally check urine given complaints of some urinary tract infection type symptoms.  ----------------------------------------- 1:32 PM on 02/16/2016 -----------------------------------------  Second troponin negative.  This point had a discussion with patient. Would like patient follow up with cardiology. She does state her pain has gone away at this time. Will discharge with cardiology follow-up.  ____________________________________________   FINAL CLINICAL IMPRESSION(S) / ED DIAGNOSES  Final diagnoses:  Chest pain, unspecified chest pain type     Note: This dictation was prepared with Dragon dictation. Any transcriptional errors that result from this process are unintentional    Phineas SemenGraydon Giordano Getman, MD 02/16/16 1332

## 2016-02-16 NOTE — Discharge Instructions (Signed)
Please seek medical attention for any high fevers, chest pain, shortness of breath, change in behavior, persistent vomiting, bloody stool or any other new or concerning symptoms.   Dolor de pecho inespecfico (Nonspecific Chest Pain) Suele ser difcil encontrar la causa del dolor de [place]. Siempre existe una posibilidad de que el dolor est relacionado con algo grave, como un infarto de miocardio o un cogulo sanguneo en los pulmones. Hay muchas enfermedades que no son potencialmente mortales que pueden causar dolor de [place]. Es importante que concurra a las visitas de control con el mdico.  CUIDADOS EN EL HOGAR  Si le recetaron antibiticos, asegrese de terminarlos, incluso si comienza a Actorsentirse mejor.  Evite las SUPERVALU INCactividades que le causen dolor de [place].  No consuma ningn producto que contenga tabaco, lo que incluye cigarrillos, tabaco de Theatre managermascar o Administrator, Civil Servicecigarrillos electrnicos. Si necesita ayuda para dejar de fumar, consulte al mdico.  No beba alcohol.  Tome los medicamentos solamente como se lo haya indicado el mdico.  Concurra a todas las visitas de control como se lo haya indicado el mdico. Esto es importante. Esto incluye otros estudios si el dolor de pecho no desaparece.  El mdico puede indicarle que mantenga la cabeza levantada (elevada) mientras duerme.  Haga cambios en su estilo de vida segn las indicaciones del mdico. Estos pueden incluir lo siguiente:  Education administratorracticar actividad fsica con regularidad. Pdale al mdico que le sugiera algunas actividades que sean seguras para usted.  Consumir una dieta cardiosaludable. El mdico o un especialista en alimentacin (nutricionista) pueden ayudarlo a que haga elecciones saludables.  Mantener un peso saludable.  Controlar la diabetes, si es necesario.  Reducir las situaciones de estrs. SOLICITE AYUDA SI:  El dolor de pecho no desaparece, incluso despus del tratamiento.  Tiene una erupcin cutnea con ampollas en el  pecho.  Tiene fiebre. SOLICITE AYUDA DE INMEDIATO SI:  El dolor en el pecho es ms intenso.  La tos empeora, o expectora sangre.  Siente un dolor intenso en el vientre (abdomen).  Se siente muy dbil.  Pierde el conocimiento (se desmaya).  Tiene escalofros.  Tiene una molestia repentina e inexplicable en el pecho.  Tiene molestias repentinas e Exxon Mobil Corporationinexplicables en los brazos, la espalda, el cuello o la [place].  Le falta el aire en cualquier momento.  Comienza a sudar de [place] repentina o la piel se le humedece.  Siente nuseas.  Vomita.  Se siente repentinamente mareado o se desmaya.  Siente que el corazn comienza a latir rpidamente o que se saltea latidos. Estos sntomas pueden Customer service managerindicar una emergencia. No espere hasta que los sntomas desaparezcan. Solicite atencin mdica de inmediato. Comunquese con el servicio de emergencias de su localidad (911 en los [place]). No conduzca por sus propios medios OfficeMax Incorporatedhasta el hospital.   Esta informacin no tiene Theme park managercomo fin reemplazar el consejo del mdico. Asegrese de hacerle al mdico cualquier pregunta que tenga.   Document Released: 12/07/2008 Document Revised: 10/01/2014 Elsevier Interactive Patient Education [employer]2016 Elsevier Inc.

## 2016-02-16 NOTE — ED Notes (Signed)
Pt arrived by POV with complaints of palpitations and chest pain that is "coming and going".

## 2016-02-21 ENCOUNTER — Ambulatory Visit: Payer: Self-pay | Admitting: Family Medicine

## 2016-02-21 VITALS — BP 123/65 | HR 70 | Temp 98.3°F | Wt 244.0 lb

## 2016-02-21 DIAGNOSIS — E119 Type 2 diabetes mellitus without complications: Secondary | ICD-10-CM

## 2016-02-21 DIAGNOSIS — K219 Gastro-esophageal reflux disease without esophagitis: Secondary | ICD-10-CM

## 2016-02-21 DIAGNOSIS — E7800 Pure hypercholesterolemia, unspecified: Secondary | ICD-10-CM

## 2016-02-21 DIAGNOSIS — K921 Melena: Secondary | ICD-10-CM | POA: Insufficient documentation

## 2016-02-21 DIAGNOSIS — R1032 Left lower quadrant pain: Secondary | ICD-10-CM | POA: Insufficient documentation

## 2016-02-21 DIAGNOSIS — I10 Essential (primary) hypertension: Secondary | ICD-10-CM

## 2016-02-21 DIAGNOSIS — Z1239 Encounter for other screening for malignant neoplasm of breast: Secondary | ICD-10-CM | POA: Insufficient documentation

## 2016-02-21 LAB — GLUCOSE, POCT (MANUAL RESULT ENTRY): POC GLUCOSE: 126 mg/dL — AB (ref 70–99)

## 2016-02-21 MED ORDER — RANITIDINE HCL 150 MG PO CAPS: 150.0000 mg | ORAL_CAPSULE | Freq: Two times a day (BID) | ORAL | Status: DC

## 2016-02-21 MED ORDER — METFORMIN HCL 1000 MG PO TABS: 1000.0000 mg | ORAL_TABLET | Freq: Two times a day (BID) | ORAL | Status: DC

## 2016-02-21 MED ORDER — LOSARTAN POTASSIUM 50 MG PO TABS: 50.0000 mg | ORAL_TABLET | Freq: Every day | ORAL | Status: DC

## 2016-02-21 MED ORDER — METOPROLOL TARTRATE 25 MG PO TABS: 25.0000 mg | ORAL_TABLET | Freq: Two times a day (BID) | ORAL | Status: DC

## 2016-02-21 MED ORDER — GLIPIZIDE 10 MG PO TABS: 10.0000 mg | ORAL_TABLET | Freq: Two times a day (BID) | ORAL | Status: DC

## 2016-02-21 MED ORDER — SIMVASTATIN 20 MG PO TABS: 20.0000 mg | ORAL_TABLET | Freq: Every day | ORAL | Status: DC

## 2016-02-21 NOTE — Progress Notes (Signed)
BP 123/65 mmHg  Pulse 70  Temp(Src) 98.3 F (36.8 C)  Wt 244 lb (110.678 kg)   Subjective:    Patient ID: Kelly HumphreyAna C Rose, female    DOB: 08/09/1951, 65 y.o.   MRN: [id number]  HPI: Kelly Rose is a 65 y.o. female  Chief Complaint  Patient presents with  . Diabetes    Follow up  . Hyperlipidemia    Follow up   She was in the ER with chest pain; referred to Dr. Welton FlakesKhan; going to have catheter soon, already had the echo; has CT angiogram on June 1st; still having chest pain at times, not like pressure; not constant, like a little bit, little headache, dizzy; she is going to take 50 mg metoprolol the night before and again the morning of the test; labs reviewed, trop I negative, not anemic  Having pain in the lower left side; just sometimes; sting; no nausea now; black diarrhea; never had a colonoscopy  Last pap smear two years ago; needs mammogram Last period at age 65  Sugars have been okay; 126 today; last A1c was 6.9; type 2 DM; on metformin and sulfonylurea; no problems with feet; she needs to schedule her eye exam  High cholesterol; last SGPT and lpids reviewed; on statin  Acid reflux; they gave her some samples of something to take short-term, then she can back on her H2 blocker; she was givne samples, but she doesn't know the name of them  No flowsheet data found.  No flowsheet data found.  Relevant past medical, surgical, family and social history reviewed Past Medical History  Diagnosis Date  . Diabetes mellitus without complication (HCC)   . Hypertension    Past Surgical History  Procedure Laterality Date  . Umbilical hernia repair  1994    Family History  Problem Relation Age of Onset  . Cancer Neg Hx   . Depression Neg Hx     Social History  Substance Use Topics  . Smoking status: Never Smoker   . Smokeless tobacco: Never Used  . Alcohol Use: Yes     Comment: Only socially   Labs from Feb 2017 reviewed, A1c was only 6.9; excellent lipid panel, LDL 99;  SGPT normal  Interim medical history since last visit reviewed. Allergies and medications reviewed  Review of Systems Per HPI unless specifically indicated above     Objective:    BP 123/65 mmHg  Pulse 70  Temp(Src) 98.3 F (36.8 C)  Wt 244 lb (110.678 kg)  Wt Readings from Last 3 Encounters:  02/21/16 244 lb (110.678 kg)  02/16/16 223 lb (101.152 kg)  11/22/15 224 lb (101.606 kg)    Physical Exam  Constitutional: She appears well-developed and well-nourished. No distress.  HENT:  Head: Normocephalic and atraumatic.  Eyes: EOM are normal. No scleral icterus.  Neck: No JVD present.  Cardiovascular: Normal rate, regular rhythm and normal heart sounds.   Pulmonary/Chest: Effort normal and breath sounds normal. No respiratory distress. She has no wheezes.  Abdominal: Soft. Bowel sounds are normal. She exhibits no distension. There is tenderness (LLQ). There is no guarding.  Musculoskeletal: She exhibits no edema.  Neurological: She is alert.  Skin: Skin is warm. No pallor.  Psychiatric: She has a normal mood and affect.   Diabetic Foot Form - Detailed   Diabetic Foot Exam - detailed  Diabetic Foot exam was performed with the following findings:  Yes 02/21/2016  6:41 PM  Visual Foot Exam completed.:  Yes  Are the toenails long?:  No  Are the toenails thick?:  No  Are the toenails ingrown?:  No  Normal Range of Motion:  Yes    Pulse Foot Exam completed.:  Yes  Right Dorsalis Pedis:  Present Left Dorsalis Pedis:  Present  Sensory Foot Exam Completed.:  Yes  Semmes-Weinstein Monofilament Test  R Site 1-Great Toe:  Pos L Site 1-Great Toe:  Pos  R Site 4:  Pos L Site 4:  Pos  R Site 5:  Pos L Site 5:  Pos        Results for orders placed or performed in visit on 02/21/16  POCT Glucose (CBG)  Result Value Ref Range   POC Glucose 126 (A) 70 - 99 mg/dl      Assessment & Plan:   Problem List Items Addressed This Visit      Cardiovascular and Mediastinum    Hypertension    Excellent control; continue meds      Relevant Medications   simvastatin (ZOCOR) 20 MG tablet   metoprolol tartrate (LOPRESSOR) 25 MG tablet   losartan (COZAAR) 50 MG tablet     Digestive   GERD (gastroesophageal reflux disease)    On H2 blocker usually, but has recently been given samples of what sounds like PPI to use short-term, then can go back to H2 blocker      Relevant Medications   ranitidine (ZANTAC) 150 MG capsule     Endocrine   Diabetes (HCC) - Primary    Foot exam by MD today      Relevant Medications   simvastatin (ZOCOR) 20 MG tablet   metFORMIN (GLUCOPHAGE) 1000 MG tablet   losartan (COZAAR) 50 MG tablet   glipiZIDE (GLUCOTROL) 10 MG tablet   Other Relevant Orders   POCT Glucose (CBG) (Completed)     Other   Abdominal pain, LLQ (left lower quadrant)    Discussed ddx including colitis, diverticulitis, ovarian pathology, etc; with her black stool x 1, will refer for colonoscopy and get CT scan; HOLD metformin 2 days before and after contrast; to ER if worse      Relevant Orders   Ambulatory referral to Gastroenterology   CT Abdomen Pelvis W Contrast   Black stool    No anemia noted on recent CBC; will refer to GI for colonoscopy      Relevant Orders   Ambulatory referral to Gastroenterology   CT Abdomen Pelvis W Contrast   Breast cancer screening    mammo ordered; pt may call to schedule      Relevant Orders   MM DIGITAL SCREENING BILATERAL   Hypercholesteremia    Check lipids in August at next appt; last set reviewed Feb 2017, very good; continue statin      Relevant Medications   simvastatin (ZOCOR) 20 MG tablet   metoprolol tartrate (LOPRESSOR) 25 MG tablet   losartan (COZAAR) 50 MG tablet       Follow up plan: Return in about 3 months (around 05/23/2016) for diabetes, cholesterol.  An after-visit summary was printed and given to the patient at check-out.  Please see the patient instructions which may contain other  information and recommendations beyond what is mentioned above in the assessment and plan.  Meds ordered this encounter  Medications  . DISCONTD: metFORMIN (GLUCOPHAGE) 1000 MG tablet    Sig: Take 1,000 mg by mouth 2 (two) times daily with a meal.  . simvastatin (ZOCOR) 20 MG tablet    Sig: Take 1 tablet (  20 mg total) by mouth at bedtime.    Dispense:  30 tablet    Refill:  2  . ranitidine (ZANTAC) 150 MG capsule    Sig: Take 1 capsule (150 mg total) by mouth 2 (two) times daily.    Dispense:  60 capsule    Refill:  2  . metoprolol tartrate (LOPRESSOR) 25 MG tablet    Sig: Take 1 tablet (25 mg total) by mouth 2 (two) times daily.    Dispense:  60 tablet    Refill:  2  . metFORMIN (GLUCOPHAGE) 1000 MG tablet    Sig: Take 1 tablet (1,000 mg total) by mouth 2 (two) times daily with a meal.    Dispense:  60 tablet    Refill:  2  . losartan (COZAAR) 50 MG tablet    Sig: Take 1 tablet (50 mg total) by mouth daily.    Dispense:  30 tablet    Refill:  2  . glipiZIDE (GLUCOTROL) 10 MG tablet    Sig: Take 1 tablet (10 mg total) by mouth 2 (two) times daily.    Dispense:  30 tablet    Refill:  2    Orders Placed This Encounter  Procedures  . MM DIGITAL SCREENING BILATERAL  . CT Abdomen Pelvis W Contrast  . Ambulatory referral to Gastroenterology  . POCT Glucose (CBG)

## 2016-02-21 NOTE — Assessment & Plan Note (Signed)
No anemia noted on recent CBC; will refer to GI for colonoscopy

## 2016-02-21 NOTE — Assessment & Plan Note (Signed)
Excellent control; continue meds 

## 2016-02-21 NOTE — Assessment & Plan Note (Signed)
Check lipids in August at next appt; last set reviewed Feb 2017, very good; continue statin

## 2016-02-21 NOTE — Assessment & Plan Note (Signed)
mammo ordered; pt may call to schedule

## 2016-02-21 NOTE — Assessment & Plan Note (Signed)
On H2 blocker usually, but has recently been given samples of what sounds like PPI to use short-term, then can go back to H2 blocker

## 2016-02-21 NOTE — Assessment & Plan Note (Signed)
Discussed ddx including colitis, diverticulitis, ovarian pathology, etc; with her black stool x 1, will refer for colonoscopy and get CT scan; HOLD metformin 2 days before and after contrast; to ER if worse

## 2016-02-21 NOTE — Patient Instructions (Addendum)
Do not take your metformin for now Hold it until 2 days after your are finished with your heart tests because the contrast dye plus metformin could hurt your kidneys  Please do call to schedule your mammogram; the number to schedule one at either [facility] or [facility] is [phone]  We'll have you get a CT scan of your abdomen; when you have that scan, no metformin for 2 days before or after that scan too  Continue aspirin  Please check your feet every night  Do schedule an eye exam when you return  We'll have you see a GI doctor for a colonoscopy  If you have not heard about these appointments by next week, please call us If you develop bad abdominal pain or chest pain, go to the ER or call 911

## 2016-02-21 NOTE — Assessment & Plan Note (Signed)
Foot exam by MD today 

## 2016-03-28 ENCOUNTER — Ambulatory Visit: Admission: RE | Admit: 2016-03-28 | Payer: Self-pay | Source: Ambulatory Visit

## 2016-04-29 ENCOUNTER — Encounter: Payer: Self-pay | Admitting: Emergency Medicine

## 2016-04-29 ENCOUNTER — Emergency Department
Admission: EM | Admit: 2016-04-29 | Discharge: 2016-04-30 | Disposition: A | Discharge status: Home / Self Care | Payer: Self-pay | Attending: Emergency Medicine | Admitting: Emergency Medicine

## 2016-04-29 DIAGNOSIS — Z7984 Long term (current) use of oral hypoglycemic drugs: Secondary | ICD-10-CM | POA: Insufficient documentation

## 2016-04-29 DIAGNOSIS — Z7982 Long term (current) use of aspirin: Secondary | ICD-10-CM | POA: Insufficient documentation

## 2016-04-29 DIAGNOSIS — G8929 Other chronic pain: Secondary | ICD-10-CM | POA: Insufficient documentation

## 2016-04-29 DIAGNOSIS — E119 Type 2 diabetes mellitus without complications: Secondary | ICD-10-CM | POA: Insufficient documentation

## 2016-04-29 DIAGNOSIS — F32A Depression, unspecified: Secondary | ICD-10-CM

## 2016-04-29 DIAGNOSIS — F329 Major depressive disorder, single episode, unspecified: Secondary | ICD-10-CM | POA: Insufficient documentation

## 2016-04-29 DIAGNOSIS — I10 Essential (primary) hypertension: Secondary | ICD-10-CM | POA: Insufficient documentation

## 2016-04-29 DIAGNOSIS — R109 Unspecified abdominal pain: Secondary | ICD-10-CM | POA: Insufficient documentation

## 2016-04-29 DIAGNOSIS — F331 Major depressive disorder, recurrent, moderate: Secondary | ICD-10-CM

## 2016-04-29 HISTORY — DX: Depression, unspecified: F32.A

## 2016-04-29 HISTORY — DX: Major depressive disorder, single episode, unspecified: F32.9

## 2016-04-29 LAB — ACETAMINOPHEN LEVEL

## 2016-04-29 LAB — SALICYLATE LEVEL

## 2016-04-29 LAB — URINE DRUG SCREEN, QUALITATIVE (ARMC ONLY)
Amphetamines, Ur Screen: NOT DETECTED
BARBITURATES, UR SCREEN: NOT DETECTED
BENZODIAZEPINE, UR SCRN: NOT DETECTED
CANNABINOID 50 NG, UR ~~LOC~~: NOT DETECTED
Cocaine Metabolite,Ur ~~LOC~~: NOT DETECTED
MDMA (Ecstasy)Ur Screen: NOT DETECTED
METHADONE SCREEN, URINE: NOT DETECTED
OPIATE, UR SCREEN: NOT DETECTED
Phencyclidine (PCP) Ur S: NOT DETECTED
Tricyclic, Ur Screen: NOT DETECTED

## 2016-04-29 LAB — CBC
HEMATOCRIT: 38.3 % (ref 35.0–47.0)
Hemoglobin: 13 g/dL (ref 12.0–16.0)
MCH: 30.6 pg (ref 26.0–34.0)
MCHC: 34.1 g/dL (ref 32.0–36.0)
MCV: 89.7 fL (ref 80.0–100.0)
Platelets: 343 10*3/uL (ref 150–440)
RBC: 4.27 MIL/uL (ref 3.80–5.20)
RDW: 14.4 % (ref 11.5–14.5)
WBC: 6.1 10*3/uL (ref 3.6–11.0)

## 2016-04-29 LAB — COMPREHENSIVE METABOLIC PANEL
ALK PHOS: 53 U/L (ref 38–126)
ALT: 22 U/L (ref 14–54)
ANION GAP: 7 (ref 5–15)
AST: 26 U/L (ref 15–41)
Albumin: 3.9 g/dL (ref 3.5–5.0)
BILIRUBIN TOTAL: 0.7 mg/dL (ref 0.3–1.2)
BUN: 12 mg/dL (ref 6–20)
CALCIUM: 9.1 mg/dL (ref 8.9–10.3)
CO2: 25 mmol/L (ref 22–32)
Chloride: 105 mmol/L (ref 101–111)
Creatinine, Ser: 0.7 mg/dL (ref 0.44–1.00)
GFR calc non Af Amer: >60 mL/min (ref >60)
GLUCOSE: 258 mg/dL — AB (ref 65–99)
Potassium: 3.6 mmol/L (ref 3.5–5.1)
Sodium: 137 mmol/L (ref 135–145)
TOTAL PROTEIN: 8 g/dL (ref 6.5–8.1)

## 2016-04-29 LAB — ETHANOL: Alcohol, Ethyl (B): <5 mg/dL (ref <5)

## 2016-04-29 MED ORDER — ACETAMINOPHEN 325 MG PO TABS
ORAL_TABLET | ORAL | Status: AC
  Administered 2016-04-29: 650 mg via ORAL
  Filled 2016-04-29: qty 2

## 2016-04-29 MED ORDER — GLIPIZIDE 10 MG PO TABS
10.0000 mg | ORAL_TABLET | Freq: Two times a day (BID) | ORAL | Status: DC
  Administered 2016-04-30: 10 mg via ORAL
  Filled 2016-04-29: qty 1

## 2016-04-29 MED ORDER — METOPROLOL TARTRATE 25 MG PO TABS
25.0000 mg | ORAL_TABLET | Freq: Two times a day (BID) | ORAL | Status: DC
  Administered 2016-04-29 – 2016-04-30 (×2): 25 mg via ORAL
  Filled 2016-04-29 (×3): qty 1

## 2016-04-29 MED ORDER — ACETAMINOPHEN 325 MG PO TABS
650.0000 mg | ORAL_TABLET | Freq: Once | ORAL | Status: AC
  Administered 2016-04-29: 650 mg via ORAL

## 2016-04-29 MED ORDER — METFORMIN HCL 500 MG PO TABS
1000.0000 mg | ORAL_TABLET | Freq: Two times a day (BID) | ORAL | Status: DC
  Administered 2016-04-30: 1000 mg via ORAL
  Filled 2016-04-29: qty 2

## 2016-04-29 MED ORDER — SIMVASTATIN 40 MG PO TABS
20.0000 mg | ORAL_TABLET | Freq: Every day | ORAL | Status: DC
  Administered 2016-04-29: 20 mg via ORAL
  Filled 2016-04-29: qty 1

## 2016-04-29 MED ORDER — SERTRALINE HCL 50 MG PO TABS
25.0000 mg | ORAL_TABLET | Freq: Every day | ORAL | Status: DC
  Administered 2016-04-29 – 2016-04-30 (×2): 25 mg via ORAL
  Filled 2016-04-29 (×2): qty 1

## 2016-04-29 MED ORDER — CLONAZEPAM 0.5 MG PO TABS
0.5000 mg | ORAL_TABLET | Freq: Every evening | ORAL | Status: DC | PRN
  Administered 2016-04-29: 0.5 mg via ORAL
  Filled 2016-04-29 (×2): qty 1

## 2016-04-29 MED ORDER — LOSARTAN POTASSIUM 50 MG PO TABS
50.0000 mg | ORAL_TABLET | Freq: Every morning | ORAL | Status: DC
  Administered 2016-04-30: 50 mg via ORAL
  Filled 2016-04-29: qty 1

## 2016-04-29 MED ORDER — FAMOTIDINE 20 MG PO TABS
20.0000 mg | ORAL_TABLET | Freq: Two times a day (BID) | ORAL | Status: DC
  Administered 2016-04-29 – 2016-04-30 (×2): 20 mg via ORAL
  Filled 2016-04-29 (×3): qty 1

## 2016-04-29 MED ORDER — RANITIDINE HCL 150 MG/10ML PO SYRP: 150.0000 mg | ORAL_SOLUTION | Freq: Two times a day (BID) | ORAL | Status: DC
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
## 2016-04-29 NOTE — ED Notes (Signed)
Pt. Alert and oriented, warm and dry, in no distress. Pt. Denies SI, HI, and AVH. Pt. Encouraged to let nursing staff know of any concerns or needs. 

## 2016-04-29 NOTE — ED Notes (Signed)
Anado (patients son) is requesting his number to be left. [phone] and/or [phone].

## 2016-04-29 NOTE — ED Notes (Addendum)
Patient's daughter Kelly Rose, [phone].   Patient's son Kelly Rose:   [phone] Patient's daughter  Kelly Rose:  [phone]

## 2016-04-29 NOTE — ED Notes (Signed)

## 2016-04-29 NOTE — ED Provider Notes (Signed)
[facility] Emergency Department Provider Note   ____________________________________________   First MD Initiated Contact with Patient 04/29/16 1510     (approximate)  I have reviewed the triage vital signs and the nursing notes.   HISTORY  Chief Complaint Mental Health Problem    HPI Kelly Rose is a 65 y.o. female with a history of depression as well as diabetes who is presenting to the emergency department today feeling hopeless. She says that her husband left her unexpectedly 1 month ago. She says that ever since then she has been surgical place to live. She says that her husband was not paying the mortgage and that she is often sleeping now at her children's homes. She says that she is not sleeping. Says that she sleeps a maximum of 3-4 hours per night. Says that she feels hopeless but denies any suicidal or homicidal ideation. Denies any drug use or smoking or drinking.   Past Medical History:  Diagnosis Date  . Depression   . Diabetes mellitus without complication (HCC)   . Hypertension     Patient Active Problem List   Diagnosis Date Noted  . Black stool 02/21/2016  . Abdominal pain, LLQ (left lower quadrant) 02/21/2016  . Breast cancer screening 02/21/2016  . GERD (gastroesophageal reflux disease) 11/23/2015  . Diabetes (HCC) 11/22/2015  . Hypertension 11/22/2015  . Hypercholesteremia 11/22/2015    Past Surgical History:  Procedure Laterality Date  . UMBILICAL HERNIA REPAIR  1994    Prior to Admission medications   Medication Sig Start Date End Date Taking? Authorizing Provider  aspirin 81 MG tablet Take 81 mg by mouth daily.    Historical Provider, MD  glipiZIDE (GLUCOTROL) 10 MG tablet Take 1 tablet (10 mg total) by mouth 2 (two) times daily. 02/21/16   Kerman PasseyMelinda P Lada, MD  losartan (COZAAR) 50 MG tablet Take 1 tablet (50 mg total) by mouth daily. 02/21/16   Kerman PasseyMelinda P Lada, MD  metFORMIN (GLUCOPHAGE) 1000 MG tablet Take 1 tablet  (1,000 mg total) by mouth 2 (two) times daily with a meal. 02/21/16   Kerman PasseyMelinda P Lada, MD  metoprolol tartrate (LOPRESSOR) 25 MG tablet Take 1 tablet (25 mg total) by mouth 2 (two) times daily. 02/21/16   Kerman PasseyMelinda P Lada, MD  ranitidine (ZANTAC) 150 MG capsule Take 1 capsule (150 mg total) by mouth 2 (two) times daily. 02/21/16   Kerman PasseyMelinda P Lada, MD  simvastatin (ZOCOR) 20 MG tablet Take 1 tablet (20 mg total) by mouth at bedtime. 02/21/16   Kerman PasseyMelinda P Lada, MD    Allergies Lisinopril  Family History  Problem Relation Age of Onset  . Cancer Neg Hx   . Depression Neg Hx     Social History Social History  Substance Use Topics  . Smoking status: Never Smoker  . Smokeless tobacco: Never Used  . Alcohol use Yes     Comment: Only socially    Review of Systems Constitutional: No fever/chills Eyes: No visual changes. ENT: No sore throat. Cardiovascular: Denies chest pain. Respiratory: Denies shortness of breath. Gastrointestinal: No abdominal pain.  No nausea, no vomiting.  No diarrhea.  No constipation. Genitourinary: Negative for dysuria. Musculoskeletal: Negative for back pain. Skin: Negative for rash. Neurological: Negative for headaches, focal weakness or numbness.  10-point ROS otherwise negative.  ____________________________________________   PHYSICAL EXAM:  VITAL SIGNS: ED Triage Vitals  Enc Vitals Group     BP 04/29/16 1410 (!) 162/75     Pulse Rate 04/29/16  1410 75     Resp 04/29/16 1410 16     Temp 04/29/16 1410 98.1 F (36.7 C)     Temp Source 04/29/16 1410 Oral     SpO2 04/29/16 1410 97 %     Weight 04/29/16 1410 229 lb (103.9 kg)     Height 04/29/16 1410  (1.702 m)     Head Circumference --      Peak Flow --      Pain Score 04/29/16 1412 0     Pain Loc --      Pain Edu? --      Excl. in GC? --     Constitutional: Alert and oriented. Well appearing and in no acute distress. Eyes: Conjunctivae are normal. PERRL. EOMI. Head: Atraumatic. Nose: No  congestion/rhinnorhea. Mouth/Throat: Mucous membranes are moist.   Neck: No stridor.   Cardiovascular: Normal rate, regular rhythm. Grossly normal heart sounds.   Respiratory: Normal respiratory effort.  No retractions. Lungs CTAB. Gastrointestinal: Soft and nontender. No distention.  Musculoskeletal: No lower extremity tenderness nor edema.  No joint effusions. Neurologic:  Normal speech and language. No gross focal neurologic deficits are appreciated.  Skin:  Skin is warm, dry and intact. No rash noted. Psychiatric: Mood and affect are normal. Speech and behavior are normal.  ____________________________________________   LABS (all labs ordered are listed, but only abnormal results are displayed)  Labs Reviewed  COMPREHENSIVE METABOLIC PANEL - Abnormal; Notable for the following:       Result Value   Glucose, Bld 258 (*)    All other components within normal limits  ACETAMINOPHEN LEVEL - Abnormal; Notable for the following:    Acetaminophen (Tylenol), Serum <10 (*)    All other components within normal limits  ETHANOL  SALICYLATE LEVEL  CBC  URINE DRUG SCREEN, QUALITATIVE ([facility] ONLY)   ____________________________________________  EKG   ____________________________________________  RADIOLOGY   ____________________________________________   PROCEDURES  Procedure(s) performed:   Procedures  Critical Care performed:   ____________________________________________   INITIAL IMPRESSION / ASSESSMENT AND PLAN / ED COURSE  Pertinent labs & imaging results that were available during my care of the patient were reviewed by me and considered in my medical decision making (see chart for details).  Patient appears depressed but without any suicidal or homicidal ideation. I will not be committing her at this time.  Clinical Course   Patient was evaluated by the specialist on-call psychiatrist and a recommendation was made for inpatient  treatment.  ____________________________________________   FINAL CLINICAL IMPRESSION(S) / ED DIAGNOSES  Depression.    NEW MEDICATIONS STARTED DURING THIS VISIT:  New Prescriptions   No medications on file     Note:  This document was prepared using Dragon voice recognition software and may include unintentional dictation errors.    Myrna Blazer, MD 04/29/16 [phone]

## 2016-04-29 NOTE — ED Notes (Signed)
Patient is talking with psychiatrist on call per telepsych.
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
## 2016-04-29 NOTE — ED Triage Notes (Signed)
Patient states that about one month ago began feeling "not well".  In the past patient has taken medications for depression.  Patient reports not sleeping well and is crying all of the time.  Most recently patient states husband has left her and her younger daughter, who has bipolar depression, needs help with her children.  Patient is not staying in her home, as home is being foreclosed on, so patient feels she is homeless and staying between both daughter's homes.  Patient states she has been having bad thoughts.  States "I don't want anything to happen to me, but I am frustrated."

## 2016-04-29 NOTE — ED Notes (Signed)
All patient belongings given to patient's daughter Kelly Rose.

## 2016-04-29 NOTE — ED Notes (Signed)
Patient states her husband just left her and he daughter is bi polar. Patient was dx with depression a year ago but over the past couple days it has gotten much worse. Patient states, "I just cry a lot. I am homeless. I just bounce around. I try to help my daughter but I have so many thoughts in my head".

## 2016-04-29 NOTE — ED Notes (Signed)
ED BHU PLACEMENT JUSTIFICATION Is the patient under IVC or is there intent for IVC: No. Is the patient medically cleared: Yes.   Is there vacancy in the ED BHU: Yes.   Is the population mix appropriate for patient: Yes.   Is the patient awaiting placement in inpatient or outpatient setting: No. Has the patient had a psychiatric consult: Yes.   Survey of unit performed for contraband, proper placement and condition of furniture, tampering with fixtures in bathroom, shower, and each patient room: Yes.  ; Findings: NA APPEARANCE/BEHAVIOR calm, cooperative and adequate rapport can be established NEURO ASSESSMENT Orientation: time, place and person Hallucinations: No.None noted (Hallucinations) Speech: Normal Gait: normal RESPIRATORY ASSESSMENT Normal expansion.  Clear to auscultation.  No rales, rhonchi, or wheezing. CARDIOVASCULAR ASSESSMENT regular rate and rhythm, S1, S2 normal, no murmur, click, rub or gallop GASTROINTESTINAL ASSESSMENT soft, nontender, BS WNL, no r/g EXTREMITIES normal strength, tone, and muscle mass PLAN OF CARE Provide calm/safe environment. Vital signs assessed twice daily. ED BHU Assessment once each 12-hour shift. Collaborate with intake RN daily or as condition indicates. Assure the ED provider has rounded once each shift. Provide and encourage hygiene. Provide redirection as needed. Assess for escalating behavior; address immediately and inform ED provider.  Assess family dynamic and appropriateness for visitation as needed: Yes.  ; If necessary, describe findings: NA Educate the patient/family about BHU procedures/visitation: Yes.  ; If necessary, describe findings: NA  

## 2016-04-29 NOTE — ED Notes (Signed)
Patient complained of a headache 7/10 in pain. Patient asking for some tylenol. MD made aware, verbal order for one time tylenol 650 mg to be given PO. Re-back of order for confirmation.

## 2016-04-30 ENCOUNTER — Emergency Department: Payer: Self-pay

## 2016-04-30 DIAGNOSIS — F331 Major depressive disorder, recurrent, moderate: Secondary | ICD-10-CM

## 2016-04-30 LAB — GLUCOSE, CAPILLARY: Glucose-Capillary: 135 mg/dL — ABNORMAL HIGH (ref 65–99)

## 2016-04-30 MED ORDER — FLUOXETINE HCL 20 MG PO CAPS: 20.0000 mg | ORAL_CAPSULE | Freq: Every day | ORAL | 1 refills | Status: DC

## 2016-04-30 NOTE — ED Notes (Signed)
Patient taken to CT and received scan. Awaiting further physician orders upon scan reading.

## 2016-04-30 NOTE — ED Notes (Signed)
Called daughter rebecca who will pick up pt

## 2016-04-30 NOTE — ED Notes (Signed)
Pt sent home with daughter with rx and follow up instructions

## 2016-04-30 NOTE — ED Notes (Signed)
Patient daughter Shanda BumpsJessica called informing patient that the patient's husband wanted to visit her. Patient requested to not see husband at this time. Message relayed to daughter by nursing staff.

## 2016-04-30 NOTE — BH Assessment (Signed)
Per request of Psych MD (Dr. Toni Amendlapacs), writer provided the patient with information and instructions on how to access Outpatient Mental Health & Substance Abuse Treatment ([facility]).  Patient denies SI/HI and AV/H.  Per the request of the patient, Clinical research associatewriter provided her with information for Legal Aid. Conservation officer, historic buildingsLegal Aid Legal Services Corp[employer] [place]  [place] [phone]

## 2016-04-30 NOTE — ED Notes (Signed)
Patient resting quietly in room. No noted distress or abnormal behaviors noted. Will continue 15 minute checks and observation by security camera for safety. 

## 2016-04-30 NOTE — ED Notes (Signed)
Pt being seen by DR clapacs

## 2016-04-30 NOTE — Consult Note (Signed)
[name] Consult   Reason for Consult:  Consult for 65 year old woman who comes to the emergency room saying that she's been feeling more depressed. Referring Physician:  Quentin Cornwall Patient Identification: Kelly Rose MRN:  [id number] Principal Diagnosis: Depression, major, recurrent, moderate ([place]) Diagnosis:   Patient Active Problem List   Diagnosis Date Noted  . Depression, major, recurrent, moderate (Kelly Rose) [F33.1] 04/30/2016  . Black stool [K92.1] 02/21/2016  . Abdominal pain, LLQ (left lower quadrant) [R10.32] 02/21/2016  . Breast cancer screening [Z12.39] 02/21/2016  . GERD (gastroesophageal reflux disease) [K21.9] 11/23/2015  . Diabetes ([place]) [E11.9] 11/22/2015  . Hypertension [I10] 11/22/2015  . Hypercholesteremia [E78.00] 11/22/2015    Total Time spent with patient: 1 hour  Subjective:   Kelly Rose is a 64 y.o. female patient admitted with "my husband left me a month ago".  HPI:  Patient came to the emergency room for evaluation of depression. Patient says that she's been depressed for many years. At one point she seems to indicate feeling that she's been depressed for decades. She says however that her mood is been much worse over the last couple months and especially been feeling bad since her husband left her. She lives by herself and now is apparently being evicted from the place where she was living. Sleeps poorly. Doesn't eat as well as she would like. Denies any thoughts about killing herself. Denies any psychotic symptoms. Patient said that she saw someone for evaluation for depression about a year ago and was prescribed medicine but does not remember what it was. Denies that she is drinking or abusing drugs.  Medical history: Has diabetes and high blood pressure. Says that she is compliant with her medicine.  Social history: Had been living with her husband said they were married for over 49 years he recently left her and now she is evicted. Has 6 adult  children. Sounds like she does have a place to stay with some of her children.  Substance abuse history: Denies that she drinks or abuses any drugs denies any past substance abuse problem  Past Psychiatric History: No history of psychiatric hospitalization. No history of suicide attempts. Was on medicine only for about a month and is not sure whether was evidence of any benefit.  Risk to Self: Suicidal Ideation: No Suicidal Intent: No Is patient at risk for suicide?: No Suicidal Plan?: No Access to Means: No What has been your use of drugs/alcohol within the last 12 months?: Reports of none How many times?: 0 Other Self Harm Risks: Reports of none Triggers for Past Attempts: None known Intentional Self Injurious Behavior: None Risk to Others: Homicidal Ideation: No Thoughts of Harm to Others: No Current Homicidal Intent: No Current Homicidal Plan: No Access to Homicidal Means: No Identified Victim: Reports of none History of harm to others?: No Assessment of Violence: None Noted Violent Behavior Description: Reports of none Does patient have access to weapons?: No Criminal Charges Pending?: No Does patient have a court date: No Prior Inpatient Therapy: Prior Inpatient Therapy: No (Reports of none) Prior Therapy Dates: Reports of none Prior Therapy Facilty/Provider(s): Reports of none Reason for Treatment: Reports of none Prior Outpatient Therapy: Prior Outpatient Therapy: Yes Prior Therapy Dates: Summer 2016 Prior Therapy Facilty/Provider(s): [facility] Reason for Treatment: Depression Does patient have an ACCT team?: No Does patient have Intensive In-House Services?  : No Does patient have [facility] services? : No Does patient have P4CC services?: No  Past Medical History:  Past  Medical History:  Diagnosis Date  . Depression   . Diabetes mellitus without complication ([place])   . Hypertension     Past Surgical History:  Procedure Laterality Date  .  UMBILICAL HERNIA REPAIR  1994   Family History:  Family History  Problem Relation Age of Onset  . Cancer Neg Hx   . Depression Neg Hx    Family Psychiatric  History: Has a daughter who supposedly has bipolar disorder Social History:  History  Alcohol Use  . Yes    Comment: Only socially     History  Drug Use No    Social History   Social History  . Marital status: Legally Separated    Spouse name: N/A  . Number of children: N/A  . Years of education: N/A   Social History Main Topics  . Smoking status: Never Smoker  . Smokeless tobacco: Never Used  . Alcohol use Yes     Comment: Only socially  . Drug use: No  . Sexual activity: Not Asked   Other Topics Concern  . None   Social History Narrative  . None   Additional Social History:    Allergies:   Allergies  Allergen Reactions  . Lisinopril Swelling and Palpitations    Labs:  Results for orders placed or performed during the hospital encounter of 04/29/16 (from the past 48 hour(s))  Comprehensive metabolic panel     Status: Abnormal   Collection Time: 04/29/16  2:00 PM  Result Value Ref Range   Sodium 137 135 - 145 mmol/L   Potassium 3.6 3.5 - 5.1 mmol/L   Chloride 105 101 - 111 mmol/L   CO2 25 22 - 32 mmol/L   Glucose, Bld 258 (H) 65 - 99 mg/dL   BUN 12 6 - 20 mg/dL   Creatinine, Ser 0.70 0.44 - 1.00 mg/dL   Calcium 9.1 8.9 - 10.3 mg/dL   Total Protein 8.0 6.5 - 8.1 g/dL   Albumin 3.9 3.5 - 5.0 g/dL   AST 26 15 - 41 U/L   ALT 22 14 - 54 U/L   Alkaline Phosphatase 53 38 - 126 U/L   Total Bilirubin 0.7 0.3 - 1.2 mg/dL   GFR calc non Af Amer >60 >60 mL/min   GFR calc Af Amer >60 >60 mL/min    Comment: (NOTE) The eGFR has been calculated using the CKD EPI equation. This calculation has not been validated in all clinical situations. eGFR's persistently <60 mL/min signify possible Chronic Kidney Disease.    Anion gap 7 5 - 15  Ethanol     Status: None   Collection Time: 04/29/16  2:00 PM  Result  Value Ref Range   Alcohol, Ethyl (B) <5 <5 mg/dL    Comment:        LOWEST DETECTABLE LIMIT FOR SERUM ALCOHOL IS 5 mg/dL FOR MEDICAL PURPOSES ONLY   Salicylate level     Status: None   Collection Time: 04/29/16  2:00 PM  Result Value Ref Range   Salicylate Lvl <8.3 2.8 - 30.0 mg/dL  Acetaminophen level     Status: Abnormal   Collection Time: 04/29/16  2:00 PM  Result Value Ref Range   Acetaminophen (Tylenol), Serum <10 (L) 10 - 30 ug/mL    Comment:        THERAPEUTIC CONCENTRATIONS VARY SIGNIFICANTLY. A RANGE OF 10-30 ug/mL MAY BE AN EFFECTIVE CONCENTRATION FOR MANY PATIENTS. HOWEVER, SOME ARE BEST TREATED AT CONCENTRATIONS OUTSIDE THIS RANGE. ACETAMINOPHEN CONCENTRATIONS >150 ug/mL  AT 4 HOURS AFTER INGESTION AND >50 ug/mL AT 12 HOURS AFTER INGESTION ARE OFTEN ASSOCIATED WITH TOXIC REACTIONS.   cbc     Status: None   Collection Time: 04/29/16  2:00 PM  Result Value Ref Range   WBC 6.1 3.6 - 11.0 K/uL   RBC 4.27 3.80 - 5.20 MIL/uL   Hemoglobin 13.0 12.0 - 16.0 g/dL   HCT 38.3 35.0 - 47.0 %   MCV 89.7 80.0 - 100.0 fL   MCH 30.6 26.0 - 34.0 pg   MCHC 34.1 32.0 - 36.0 g/dL   RDW 14.4 11.5 - 14.5 %   Platelets 343 150 - 440 K/uL  Urine Drug Screen, Qualitative     Status: None   Collection Time: 04/29/16  2:00 PM  Result Value Ref Range   Tricyclic, Ur Screen NONE DETECTED NONE DETECTED   Amphetamines, Ur Screen NONE DETECTED NONE DETECTED   MDMA (Ecstasy)Ur Screen NONE DETECTED NONE DETECTED   Cocaine Metabolite,Ur [place] NONE DETECTED NONE DETECTED   Opiate, Ur Screen NONE DETECTED NONE DETECTED   Phencyclidine (PCP) Ur S NONE DETECTED NONE DETECTED   Cannabinoid 50 Ng, Ur  NONE DETECTED NONE DETECTED   Barbiturates, Ur Screen NONE DETECTED NONE DETECTED   Benzodiazepine, Ur Scrn NONE DETECTED NONE DETECTED   Methadone Scn, Ur NONE DETECTED NONE DETECTED    Comment: (NOTE) 119  Tricyclics, urine               Cutoff 1000 ng/mL 200  Amphetamines, urine              Cutoff 1000 ng/mL 300  MDMA (Ecstasy), urine           Cutoff 500 ng/mL 400  Cocaine Metabolite, urine       Cutoff 300 ng/mL 500  Opiate, urine                   Cutoff 300 ng/mL 600  Phencyclidine (PCP), urine      Cutoff 25 ng/mL 700  Cannabinoid, urine              Cutoff 50 ng/mL 800  Barbiturates, urine             Cutoff 200 ng/mL 900  Benzodiazepine, urine           Cutoff 200 ng/mL 1000 Methadone, urine                Cutoff 300 ng/mL 1100 1200 The urine drug screen provides only a preliminary, unconfirmed 1300 analytical test result and should not be used for non-medical 1400 purposes. Clinical consideration and professional judgment should 1500 be applied to any positive drug screen result due to possible 1600 interfering substances. A more specific alternate chemical method 1700 must be used in order to obtain a confirmed analytical result.  1800 Gas chromato graphy / mass spectrometry (GC/MS) is the preferred 1900 confirmatory method.   Glucose, capillary     Status: Abnormal   Collection Time: 04/30/16  6:52 AM  Result Value Ref Range   Glucose-Capillary 135 (H) 65 - 99 mg/dL    Current Facility-Administered Medications  Medication Dose Route Frequency Provider Last Rate Last Dose  . clonazePAM (KLONOPIN) tablet 0.5 mg  0.5 mg Oral QHS PRN Orbie Pyo, MD   0.5 mg at 04/29/16 2200  . famotidine (PEPCID) tablet 20 mg  20 mg Oral BID Orbie Pyo, MD   20 mg at 04/30/16 0910  . glipiZIDE (GLUCOTROL)  tablet 10 mg  10 mg Oral BID AC Orbie Pyo, MD   10 mg at 04/30/16 0900  . losartan (COZAAR) tablet 50 mg  50 mg Oral q morning - 10a Orbie Pyo, MD   50 mg at 04/30/16 1046  . metFORMIN (GLUCOPHAGE) tablet 1,000 mg  1,000 mg Oral BID WC Orbie Pyo, MD   1,000 mg at 04/30/16 0900  . metoprolol tartrate (LOPRESSOR) tablet 25 mg  25 mg Oral BID Orbie Pyo, MD   25 mg at 04/30/16 5427  . sertraline  (ZOLOFT) tablet 25 mg  25 mg Oral Daily Orbie Pyo, MD   25 mg at 04/30/16 0911  . simvastatin (ZOCOR) tablet 20 mg  20 mg Oral QHS Orbie Pyo, MD   20 mg at 04/29/16 2159   Current Outpatient Prescriptions  Medication Sig Dispense Refill  . aspirin 81 MG tablet Take 81 mg by mouth daily.    Marland Kitchen FLUoxetine (PROZAC) 20 MG capsule Take 1 capsule (20 mg total) by mouth daily. 30 capsule 1  . glipiZIDE (GLUCOTROL) 10 MG tablet Take 1 tablet (10 mg total) by mouth 2 (two) times daily. 30 tablet 2  . losartan (COZAAR) 50 MG tablet Take 1 tablet (50 mg total) by mouth daily. 30 tablet 2  . metFORMIN (GLUCOPHAGE) 1000 MG tablet Take 1 tablet (1,000 mg total) by mouth 2 (two) times daily with a meal. 60 tablet 2  . metoprolol tartrate (LOPRESSOR) 25 MG tablet Take 1 tablet (25 mg total) by mouth 2 (two) times daily. 60 tablet 2  . ranitidine (ZANTAC) 150 MG capsule Take 1 capsule (150 mg total) by mouth 2 (two) times daily. 60 capsule 2  . simvastatin (ZOCOR) 20 MG tablet Take 1 tablet (20 mg total) by mouth at bedtime. 30 tablet 2    Musculoskeletal: Strength & Muscle Tone: within normal limits Gait & Station: normal Patient leans: N/A  Psychiatric Specialty Exam: Physical Exam  Nursing note and vitals reviewed. Constitutional: She appears well-developed and well-nourished.  HENT:  Head: Normocephalic and atraumatic.  Eyes: Conjunctivae are normal. Pupils are equal, round, and reactive to light.  Neck: Normal range of motion.  Cardiovascular: Normal heart sounds.   Respiratory: Effort normal.  GI: Soft.  Musculoskeletal: Normal range of motion.  Neurological: She is alert.  Skin: Skin is warm and dry.  Psychiatric: Her speech is normal and behavior is normal. Judgment and thought content normal. She exhibits a depressed mood. She exhibits abnormal recent memory.    Review of Systems  Constitutional: Negative.   HENT: Negative.   Eyes: Negative.   Respiratory:  Negative.   Cardiovascular: Negative.   Gastrointestinal: Negative.   Musculoskeletal: Negative.   Skin: Negative.   Neurological: Negative.   Psychiatric/Behavioral: Positive for depression. Negative for hallucinations, memory loss, substance abuse and suicidal ideas. The patient is nervous/anxious and has insomnia.     Blood pressure 133/73, pulse 63, temperature 97.9 F (36.6 C), temperature source Oral, resp. rate 18, height _0  (1.702 m), weight 103.9 kg (229 lb), SpO2 100 %.Body mass index is 35.87 kg/m.  General Appearance: Disheveled  Eye Contact:  Fair  Speech:  Clear and Coherent  Volume:  Decreased  Mood:  Anxious  Affect:  Congruent  Thought Process:  Goal Directed  Orientation:  Full (Time, Place, and Person)  Thought Content:  Tangential  Suicidal Thoughts:  No  Homicidal Thoughts:  No  Memory:  Immediate;   Good Recent;  Poor Remote;   Fair  Judgement:  Fair  Insight:  Fair  Psychomotor Activity:  Decreased  Concentration:  Concentration: Fair  Recall:  Poor  Fund of Knowledge:  Fair  Language:  Fair  Akathisia:  No  Handed:  Right  AIMS (if indicated):     Assets:  Desire for Improvement Resilience Social Support  ADL's:  Intact  Cognition:  Impaired,  Mild  Sleep:        Treatment Plan Summary: Medication management and Plan 65 year old woman who gives a history of major depressive symptoms that also have been worse situationally. Denies any suicidal ideation. No evidence of psychotic thinking. No past suicidality. Patient is agreeable to outpatient treatment. Does not meet criteria for inpatient treatment. I suggested that we start fluoxetine 20 mg per day as a treatment for depression. Patient is agreeable to the plan. Two-month supply will be given. Meanwhile patient will be given information with referrals to local mental health centers. Case reviewed with emergency room doctor.  Disposition: Patient does not meet criteria for psychiatric  inpatient admission. Supportive therapy provided about ongoing stressors.  Alethia Berthold, MD 04/30/2016 1:49 PM

## 2016-04-30 NOTE — ED Provider Notes (Signed)
-----------------------------------------   1:30 AM on 04/30/2016 -----------------------------------------   Blood pressure 131/70, pulse 64, temperature 97.5 F (36.4 C), temperature source Oral, resp. rate 16, height 5\' 7"  (1.702 m), weight 229 lb (103.9 kg), SpO2 100 %.  The patient had no acute events since last update.  Calm and cooperative at this time.  Disposition is pending per Psychiatry/Behavioral Medicine team recommendations.     Jennye MoccasinBrian S Jerris Keltz, MD 04/30/16 0130

## 2016-04-30 NOTE — ED Notes (Signed)
Patient in room resting. No signs of acute distress at this time. Maintained on 15 minute checks and observation by security camera for safety.

## 2016-04-30 NOTE — ED Notes (Signed)
ENVIRONMENTAL ASSESSMENT Potentially harmful objects out of patient reach: Yes Personal belongings secured: Yes Patient dressed in hospital provided attire only: Yes Plastic bags out of patient reach: Yes Patient care equipment (cords, cables, call bells, lines, and drains) shortened, removed, or accounted for: Yes Equipment and supplies removed from bottom of stretcher: Yes Potentially toxic materials out of patient reach: Yes Sharps container removed or out of patient reach: Yes  Patient in room resting. No signs of acute distress noted. Maintained on 15 minute checks and observation by security camera for safety.

## 2016-04-30 NOTE — ED Notes (Signed)
Patient asleep in room. No noted distress or abnormal behavior. Will continue 15 minute checks and observation by security cameras for safety. 

## 2016-04-30 NOTE — ED Notes (Signed)
Patient currently in room resting. No signs of acute distress noted. Maintained on 15 minute checks and observation by security camera for safety.  

## 2016-04-30 NOTE — ED Provider Notes (Addendum)
-----------------------------------------   8:54 AM on 04/30/2016 -----------------------------------------  Called to bedside, patient complaining of abdominal pain that she has had for several months. She has not had any imaging because she apparently declined imaging as an outpatient. She is worried that something is going on in her intestines. She's had normal bowel movements and no vomiting. She denies dysuria or urinary frequency. On exam her abdomen is completely benign. Patient is however also requesting food which I take to be a positive prognostic indicator. Nonetheless that this pain is been brought to the attention of multiple medical providers we will obtain imaging to ensure that no other pathology is present. Certainly does not a surgical abdomen at this time.   Jeanmarie PlantJames A Skyylar Kopf, MD 04/30/16 0855   ----------------------------------------- 2:14 PM on 04/30/2016 -----------------------------------------  Ct neg. Pt in nad no abdominal discomfort. Patient tolerating by mouth. She was evaluated by psychiatry. They feel that she is safe for discharge. Start her on medications. No ongoing SI or HI. She has been given information about follow-up. Contracts for safety.   Jeanmarie PlantJames A Willis Kuipers, MD 04/30/16 1416

## 2016-04-30 NOTE — ED Notes (Signed)
Dc onstructions and rx given waiting for daughter ,a;; pts belongings taken home by family.

## 2016-05-22 ENCOUNTER — Ambulatory Visit: Payer: Self-pay | Admitting: Family Medicine

## 2016-05-22 VITALS — BP 122/72 | HR 77 | Temp 99.7°F | Wt 234.0 lb

## 2016-05-22 DIAGNOSIS — E119 Type 2 diabetes mellitus without complications: Secondary | ICD-10-CM

## 2016-05-22 DIAGNOSIS — E7800 Pure hypercholesterolemia, unspecified: Secondary | ICD-10-CM

## 2016-05-22 DIAGNOSIS — K219 Gastro-esophageal reflux disease without esophagitis: Secondary | ICD-10-CM

## 2016-05-22 DIAGNOSIS — I10 Essential (primary) hypertension: Secondary | ICD-10-CM

## 2016-05-22 DIAGNOSIS — F331 Major depressive disorder, recurrent, moderate: Secondary | ICD-10-CM

## 2016-05-22 LAB — GLUCOSE, POCT (MANUAL RESULT ENTRY): POC GLUCOSE: 116 mg/dL — AB (ref 70–99)

## 2016-05-22 MED ORDER — LOSARTAN POTASSIUM 50 MG PO TABS: 50.0000 mg | ORAL_TABLET | Freq: Every day | ORAL | 3 refills | Status: DC

## 2016-05-22 MED ORDER — SIMVASTATIN 20 MG PO TABS: 20.0000 mg | ORAL_TABLET | Freq: Every day | ORAL | 3 refills | Status: AC

## 2016-05-22 MED ORDER — METFORMIN HCL 1000 MG PO TABS: 1000.0000 mg | ORAL_TABLET | Freq: Two times a day (BID) | ORAL | 3 refills | Status: DC

## 2016-05-22 MED ORDER — GLIPIZIDE 10 MG PO TABS: 10.0000 mg | ORAL_TABLET | Freq: Two times a day (BID) | ORAL | 2 refills | Status: DC

## 2016-05-22 MED ORDER — METOPROLOL TARTRATE 25 MG PO TABS: 25.0000 mg | ORAL_TABLET | Freq: Two times a day (BID) | ORAL | 3 refills | Status: DC

## 2016-05-22 NOTE — Assessment & Plan Note (Signed)
Under good control on omeprazole. No refill needed right now. Continue current regimen. Off zantac.

## 2016-05-22 NOTE — Assessment & Plan Note (Signed)
Under good control today. Continue current regimen. Continue to monitor. Refills given today. Call with concerns.

## 2016-05-22 NOTE — Progress Notes (Signed)
BP 122/72   Pulse 77   Temp 99.7 F (37.6 C)   Wt 234 lb (106.1 kg)   BMI 36.65 kg/m    Subjective:    Patient ID: Kelly HumphreyAna C Rose, female    DOB: 08/09/1951, 65 y.o.   MRN: [id number]  HPI: Kelly Rose is a 65 y.o. female  Chief Complaint  Patient presents with  . Diabetes    glucose 116 this morning and tonight, ate a salad/coffee beforehand  . Medication Refill    omeprazole 40mg  replacing zantac, need refill on it   DIABETES Hypoglycemic episodes:no Polydipsia/polyuria: no Visual disturbance: no Chest pain: no Paresthesias: no Glucose Monitoring: yes  Accucheck frequency: Daily  Fasting glucose: 120-116 Taking Insulin?: no Blood Pressure Monitoring: not checking Retinal Examination: Not up to Date  DEPRESSION- her husband left her suddenly about a month ago. Has been having a lot of social issues. Started on fluoxetine which is working well. Going to see psychiatry here.   Mood status: better Satisfied with current treatment?: yes  GERD was not doing well on zantac. Doing well on omeprazole. Would like to continue it.   Relevant past medical, surgical, family and social history reviewed and updated as indicated. Interim medical history since our last visit reviewed. Allergies and medications reviewed and updated.  Review of Systems  Constitutional: Negative.   Respiratory: Negative.   Cardiovascular: Negative.   Gastrointestinal: Negative for abdominal distention, abdominal pain, anal bleeding, blood in stool, constipation, diarrhea, nausea, rectal pain and vomiting.       + heartburn- better on meds   Psychiatric/Behavioral: Positive for dysphoric mood. Negative for agitation, behavioral problems, confusion, decreased concentration, hallucinations, self-injury, sleep disturbance and suicidal ideas. The patient is not nervous/anxious and is not hyperactive.     Per HPI unless specifically indicated above     Objective:    BP 122/72   Pulse 77   Temp  99.7 F (37.6 C)   Wt 234 lb (106.1 kg)   BMI 36.65 kg/m   Wt Readings from Last 3 Encounters:  05/22/16 234 lb (106.1 kg)  04/29/16 229 lb (103.9 kg)  02/21/16 244 lb (110.7 kg)    Physical Exam  Constitutional: She is oriented to person, place, and time. She appears well-developed and well-nourished. No distress.  HENT:  Head: Normocephalic and atraumatic.  Right Ear: Hearing normal.  Left Ear: Hearing normal.  Nose: Nose normal.  Eyes: Conjunctivae and lids are normal. Right eye exhibits no discharge. Left eye exhibits no discharge. No scleral icterus.  Cardiovascular: Normal rate, regular rhythm, normal heart sounds and intact distal pulses.  Exam reveals no gallop and no friction rub.   No murmur heard. Pulmonary/Chest: Effort normal and breath sounds normal. No respiratory distress. She has no wheezes. She has no rales. She exhibits no tenderness.  Musculoskeletal: Normal range of motion.  Neurological: She is alert and oriented to person, place, and time.  Skin: Skin is warm, dry and intact. No rash noted. No erythema. No pallor.  Psychiatric: She has a normal mood and affect. Her speech is normal and behavior is normal. Judgment and thought content normal. Cognition and memory are normal.  Nursing note and vitals reviewed.   Results for orders placed or performed in visit on 05/22/16  POCT Glucose (CBG)  Result Value Ref Range   POC Glucose 116 (A) 70 - 99 mg/dl      Assessment & Plan:   Problem List Items Addressed This Visit  Cardiovascular and Mediastinum   Hypertension    Under good control today. Continue current regimen. Continue to monitor. Refills given today. Call with concerns.       Relevant Medications   metoprolol tartrate (LOPRESSOR) 25 MG tablet   losartan (COZAAR) 50 MG tablet   simvastatin (ZOCOR) 20 MG tablet   Other Relevant Orders   Comprehensive metabolic panel     Digestive   GERD (gastroesophageal reflux disease)    Under good  control on omeprazole. No refill needed right now. Continue current regimen. Off zantac.      Relevant Medications   omeprazole (PRILOSEC) 40 MG capsule     Endocrine   Diabetes (HCC) - Primary    Rechecking A1c today. Will return next couple of weeks to discuss results and adjust treatment as needed.       Relevant Medications   metFORMIN (GLUCOPHAGE) 1000 MG tablet   glipiZIDE (GLUCOTROL) 10 MG tablet   losartan (COZAAR) 50 MG tablet   simvastatin (ZOCOR) 20 MG tablet   Other Relevant Orders   POCT Glucose (CBG) (Completed)   Comprehensive metabolic panel   Hgb A1c w/o eAG     Other   Hypercholesteremia    Rechecking labs today. Will return in the next couple of weeks to discuss results and adjust treatment as needed.       Relevant Medications   metoprolol tartrate (LOPRESSOR) 25 MG tablet   losartan (COZAAR) 50 MG tablet   simvastatin (ZOCOR) 20 MG tablet   Other Relevant Orders   Comprehensive metabolic panel   Lipid Panel w/o Chol/HDL Ratio   Depression, major, recurrent, moderate (HCC)    Better on the fluoxetine. Going to see psychiatry here. Continue to monitor.        Other Visit Diagnoses   None.      Follow up plan: Return Next couple of weeks to discuss lab results.

## 2016-05-22 NOTE — Assessment & Plan Note (Signed)
Better on the fluoxetine. Going to see psychiatry here. Continue to monitor.

## 2016-05-22 NOTE — Assessment & Plan Note (Signed)
Rechecking labs today. Will return in the next couple of weeks to discuss results and adjust treatment as needed.

## 2016-05-22 NOTE — Assessment & Plan Note (Signed)
Rechecking A1c today. Will return next couple of weeks to discuss results and adjust treatment as needed.

## 2016-05-23 LAB — LIPID PANEL W/O CHOL/HDL RATIO
Cholesterol, Total: 149 mg/dL (ref 100–199)
HDL: 54 mg/dL (ref >39)
LDL Calculated: 72 mg/dL (ref 0–99)
TRIGLYCERIDES: 116 mg/dL (ref 0–149)
VLDL CHOLESTEROL CAL: 23 mg/dL (ref 5–40)

## 2016-05-23 LAB — COMPREHENSIVE METABOLIC PANEL
ALBUMIN: 4 g/dL (ref 3.6–4.8)
ALT: 16 IU/L (ref 0–32)
AST: 20 IU/L (ref 0–40)
Albumin/Globulin Ratio: 1.2 (ref 1.2–2.2)
Alkaline Phosphatase: 60 IU/L (ref 39–117)
BUN/Creatinine Ratio: 10 — ABNORMAL LOW (ref 12–28)
BUN: 7 mg/dL — AB (ref 8–27)
Bilirubin Total: 0.3 mg/dL (ref 0.0–1.2)
CALCIUM: 9.4 mg/dL (ref 8.7–10.3)
CO2: 28 mmol/L (ref 18–29)
CREATININE: 0.68 mg/dL (ref 0.57–1.00)
Chloride: 101 mmol/L (ref 96–106)
GFR, EST AFRICAN AMERICAN: 108 mL/min/{1.73_m2} (ref >59)
GFR, EST NON AFRICAN AMERICAN: 94 mL/min/{1.73_m2} (ref >59)
GLUCOSE: 116 mg/dL — AB (ref 65–99)
Globulin, Total: 3.4 g/dL (ref 1.5–4.5)
Potassium: 4.4 mmol/L (ref 3.5–5.2)
Sodium: 141 mmol/L (ref 134–144)
Total Protein: 7.4 g/dL (ref 6.0–8.5)

## 2016-05-23 LAB — HGB A1C W/O EAG: HEMOGLOBIN A1C: 6.9 % — AB (ref 4.8–5.6)

## 2016-05-30 ENCOUNTER — Encounter: Payer: Self-pay | Admitting: Internal Medicine

## 2016-05-30 ENCOUNTER — Ambulatory Visit: Payer: Self-pay | Admitting: Internal Medicine

## 2016-05-30 VITALS — BP 121/77 | HR 71 | Temp 98.2°F | Wt 235.0 lb

## 2016-05-30 DIAGNOSIS — Z131 Encounter for screening for diabetes mellitus: Secondary | ICD-10-CM

## 2016-05-30 LAB — POCT GLUCOSE (DEVICE FOR HOME USE): POC GLUCOSE: 103 mg/dL — AB (ref 70–99)

## 2016-05-30 NOTE — Patient Instructions (Signed)
F/u in 3 months w/ labs: Met C, lipid, A1C, CBC, Ua, Microalbume, TSH, B12 levels

## 2016-05-30 NOTE — Progress Notes (Signed)
   Subjective:    Patient ID: Kelly Rose, female    DOB: Nov 05, 1950, 65 y.o.   MRN: [id number]  HPI   Pt. Presents for f/u on lab results. Results are stable. BP is stable at 121/77 Pt. Reports checks glucose before breakfast daily  Patient Active Problem List   Diagnosis Date Noted  . Depression, major, recurrent, moderate ([place]) 04/30/2016  . Black stool 02/21/2016  . Abdominal pain, LLQ (left lower quadrant) 02/21/2016  . Breast cancer screening 02/21/2016  . GERD (gastroesophageal reflux disease) 11/23/2015  . Diabetes ([place]) 11/22/2015  . Hypertension 11/22/2015  . Hypercholesteremia 11/22/2015     Medication List       Accurate as of 05/30/16 11:06 AM. Always use your most recent med list.          aspirin 81 MG tablet Take 81 mg by mouth daily.   FLUoxetine 20 MG capsule Commonly known as:  PROZAC Take 1 capsule (20 mg total) by mouth daily.   glipiZIDE 10 MG tablet Commonly known as:  GLUCOTROL Take 1 tablet (10 mg total) by mouth 2 (two) times daily.   losartan 50 MG tablet Commonly known as:  COZAAR Take 1 tablet (50 mg total) by mouth daily.   metFORMIN 1000 MG tablet Commonly known as:  GLUCOPHAGE Take 1 tablet (1,000 mg total) by mouth 2 (two) times daily with a meal.   metoprolol tartrate 25 MG tablet Commonly known as:  LOPRESSOR Take 1 tablet (25 mg total) by mouth 2 (two) times daily.   omeprazole 40 MG capsule Commonly known as:  PRILOSEC Take 40 mg by mouth daily.   simvastatin 20 MG tablet Commonly known as:  ZOCOR Take 1 tablet (20 mg total) by mouth at bedtime.        Review of Systems     Objective:   Physical Exam  Constitutional: She is oriented to person, place, and time.  Neurological: She is alert and oriented to person, place, and time.    BP 121/77 (BP Location: Left Arm, Patient Position: Sitting, Cuff Size: Large)   Pulse 71   Temp 98.2 F (36.8 C) (Oral)   Wt 235 lb (106.6 kg)   BMI 36.81 kg/m         Assessment & Plan:   F/u in 3 mnths w/ labs: Met C, Lipid, A1C, CBC, Ua, Microalbume, TSH, B12 levels

## 2016-05-31 ENCOUNTER — Ambulatory Visit: Payer: Self-pay | Admitting: Ophthalmology

## 2016-06-15 ENCOUNTER — Emergency Department
Admission: EM | Admit: 2016-06-15 | Discharge: 2016-06-15 | Disposition: A | Discharge status: Home / Self Care | Payer: Self-pay | Attending: Emergency Medicine | Admitting: Emergency Medicine

## 2016-06-15 ENCOUNTER — Emergency Department: Payer: Self-pay

## 2016-06-15 DIAGNOSIS — Z7984 Long term (current) use of oral hypoglycemic drugs: Secondary | ICD-10-CM | POA: Insufficient documentation

## 2016-06-15 DIAGNOSIS — Z7982 Long term (current) use of aspirin: Secondary | ICD-10-CM | POA: Insufficient documentation

## 2016-06-15 DIAGNOSIS — Y9301 Activity, walking, marching and hiking: Secondary | ICD-10-CM | POA: Insufficient documentation

## 2016-06-15 DIAGNOSIS — I10 Essential (primary) hypertension: Secondary | ICD-10-CM | POA: Insufficient documentation

## 2016-06-15 DIAGNOSIS — S8002XA Contusion of left knee, initial encounter: Secondary | ICD-10-CM | POA: Insufficient documentation

## 2016-06-15 DIAGNOSIS — G8929 Other chronic pain: Secondary | ICD-10-CM | POA: Insufficient documentation

## 2016-06-15 DIAGNOSIS — M1712 Unilateral primary osteoarthritis, left knee: Secondary | ICD-10-CM

## 2016-06-15 DIAGNOSIS — M25462 Effusion, left knee: Secondary | ICD-10-CM

## 2016-06-15 DIAGNOSIS — Y92000 Kitchen of unspecified non-institutional (private) residence as  the place of occurrence of the external cause: Secondary | ICD-10-CM | POA: Insufficient documentation

## 2016-06-15 DIAGNOSIS — W1839XA Other fall on same level, initial encounter: Secondary | ICD-10-CM | POA: Insufficient documentation

## 2016-06-15 DIAGNOSIS — E119 Type 2 diabetes mellitus without complications: Secondary | ICD-10-CM | POA: Insufficient documentation

## 2016-06-15 DIAGNOSIS — M1732 Unilateral post-traumatic osteoarthritis, left knee: Secondary | ICD-10-CM | POA: Insufficient documentation

## 2016-06-15 DIAGNOSIS — M25561 Pain in right knee: Secondary | ICD-10-CM | POA: Insufficient documentation

## 2016-06-15 DIAGNOSIS — Y999 Unspecified external cause status: Secondary | ICD-10-CM | POA: Insufficient documentation

## 2016-06-15 MED ORDER — NAPROXEN 500 MG PO TABS: 500.0000 mg | ORAL_TABLET | Freq: Two times a day (BID) | ORAL | 0 refills | Status: DC

## 2016-06-15 NOTE — ED Provider Notes (Signed)
[facility] Emergency Department Provider Note  ____________________________________________  Time seen: Approximately 10:17 AM  I have reviewed the triage vital signs and the nursing notes.   HISTORY  Chief Complaint Knee Pain    HPI Kelly Rose is a 65 y.o. female , NAD, presents to the emergency department accompanied by a family member who assists with history. Patient states she was walking into her daughter's kitchen and fell onto her left knee. Believes that the fall was mechanical as she had no loss of consciousness, chest pain, shortness breath, visual changes to cause the fall. States that she has chronic pain in the right knee but had no significant pain in the left knee until after the fall. Has noted some increasing swelling around the left knee and has significant increase in his pain with ambulation. As been using a cane to assist in ambulation. Denies any numbness, weakness, tingling. Has not had any redness or abnormal warmth to the knee or the lower leg. Denies any open wounds or skin sores.   Past Medical History:  Diagnosis Date  . Depression   . Diabetes mellitus without complication (H[facility])   . Hypertension     Patient Active Problem List   Diagnosis Date Noted  . Depression, major, recurrent, moderate (H[facility]) 04/30/2016  . Black stool 02/21/2016  . Abdominal pain, LLQ (left lower quadrant) 02/21/2016  . Breast cancer screening 02/21/2016  . GERD (gastroesophageal reflux disease) 11/23/2015  . Diabetes ([facility]) 11/22/2015  . Hypertension 11/22/2015  . Hypercholesteremia 11/22/2015    Past Surgical History:  Procedure Laterality Date  . UMBILICAL HERNIA REPAIR  1994    Prior to Admission medications   Medication Sig Start Date End Date Taking? Authorizing Provider  aspirin 81 MG tablet Take 81 mg by mouth daily.    Historical Provider, MD  FLUoxetine (PROZAC) 20 MG capsule Take 1 capsule (20 mg total) by mouth daily. 04/30/16   Audery AmelJohn T  Clapacs, MD  glipiZIDE (GLUCOTROL) 10 MG tablet Take 1 tablet (10 mg total) by mouth 2 (two) times daily. 05/22/16   Megan P Johnson, DO  losartan (COZAAR) 50 MG tablet Take 1 tablet (50 mg total) by mouth daily. 05/22/16   Megan P Johnson, DO  metFORMIN (GLUCOPHAGE) 1000 MG tablet Take 1 tablet (1,000 mg total) by mouth 2 (two) times daily with a meal. 05/22/16   Megan P Johnson, DO  metoprolol tartrate (LOPRESSOR) 25 MG tablet Take 1 tablet (25 mg total) by mouth 2 (two) times daily. 05/22/16   Megan P Johnson, DO  naproxen (NAPROSYN) 500 MG tablet Take 1 tablet (500 mg total) by mouth 2 (two) times daily with a meal. 06/15/16   Jami L Hagler, PA-C  omeprazole (PRILOSEC) 40 MG capsule Take 40 mg by mouth daily.    Historical Provider, MD  simvastatin (ZOCOR) 20 MG tablet Take 1 tablet (20 mg total) by mouth at bedtime. 05/22/16   Megan P Johnson, DO    Allergies Lisinopril  Family History  Problem Relation Age of Onset  . Hypertension Paternal Grandmother   . Cancer Neg Hx   . Depression Neg Hx     Social History Social History  Substance Use Topics  . Smoking status: Never Smoker  . Smokeless tobacco: Never Used  . Alcohol use No     Comment: Only socially     Review of Systems  Constitutional: No fever/chills, fatigue Eyes: No visual changes.. Cardiovascular: No chest pain. Respiratory: No shortness of breath.  Gastrointestinal: No abdominal pain.  No nausea, vomiting.  Musculoskeletal: Positive left knee pain. Negative left hip, left lower leg pain.  Skin: Positive swelling left knee. Negative for rash, redness, skin sores, open wounds, bruising. Neurological: Negative for numbness, weakness, tingling. 10-point ROS otherwise negative.  ____________________________________________   PHYSICAL EXAM:  VITAL SIGNS: ED Triage Vitals  Enc Vitals Group     BP 06/15/16 0944 (!) 158/81     Pulse Rate 06/15/16 0944 82     Resp 06/15/16 0944 18     Temp 06/15/16 0944 97.5 F  (36.4 C)     Temp Source 06/15/16 0944 Oral     SpO2 06/15/16 0944 95 %     Weight 06/15/16 0945 234 lb (106.1 kg)     Height 06/15/16 0945 5\' 7"  (1.702 m)     Head Circumference --      Peak Flow --      Pain Score --      Pain Loc --      Pain Edu? --      Excl. in GC? --      Constitutional: Alert and oriented. Well appearing and in no acute distress. Eyes: Conjunctivae are normal.   Head: Atraumatic. Neck: Supple with full range of motion. Cardiovascular: Normal rate, regular rhythm. Normal S1 and S2.  Good peripheral circulation With 2+ pulses noted about the left lower extremity. Respiratory: Normal respiratory effort without tachypnea or retractions. Lungs CTAB with breath sounds noted in all lung fields. Musculoskeletal: Neck and swelling noted about the left knee. Tenderness to palpation diffusely about the lateral left knee without laxity with varus or valgus stress. No laxity with anterior or posterior drawer. No masses or deformity noted about palpation of the posterior knee. Full range of motion of the left knee but with pain with full flexion as well as full extension. Mild swelling to palpation noted about the distal lateral portion of the thigh without tenderness to palpation. No tenderness to palpation about the left lower leg. No lower extremity tenderness nor edema.  No joint effusions. Neurologic:  Normal speech and language. No gross focal neurologic deficits are appreciated.  Skin:  Skin is warm, dry and intact. No rash, redness, abnormal warmth, bruising, skin sores or open wounds noted. Psychiatric: Mood and affect are normal. Speech and behavior are normal. Patient exhibits appropriate insight and judgement.   ____________________________________________   LABS  None ____________________________________________  EKG  None ____________________________________________  RADIOLOGY I have personally viewed and evaluated these images (plain radiographs) as  part of my medical decision making, as well as reviewing the written report by the radiologist.  Dg Knee Complete 4 Views Left  Result Date: 06/15/2016 CLINICAL DATA:  Status post fall.  Anterior pain. EXAM: LEFT KNEE - COMPLETE 4+ VIEW COMPARISON:  None. FINDINGS: Generalized osteopenia. Mild cortical irregularity of the periphery of the lateral tibial plateau which may reflect a osteophyte versus possible nondisplaced fracture. Large left knee joint effusion. Tricompartmental osteoarthritis of the left knee most severe in the medial femorotibial compartment with mild -moderate joint space narrowing. IMPRESSION: Mild cortical irregularity of the periphery of the lateral tibial plateau which may reflect a osteophyte versus possible nondisplaced fracture. Correlate with point tenderness. Large left knee joint effusion. Electronically Signed   By: Elige Ko   On: 06/15/2016 10:24    ____________________________________________    PROCEDURES  Procedure(s) performed: None   Procedures   Medications - No data to display   ____________________________________________  INITIAL IMPRESSION / ASSESSMENT AND PLAN / ED COURSE  Pertinent labs & imaging results that were available during my care of the patient were reviewed by me and considered in my medical decision making (see chart for details).  Clinical Course  Comment By Time  I spoke with Dr. Rosita Kea who was on-call for orthopedics in regards to the patient's history, imaging and physical exam. He suggests the patient's knee be wrapped in an Ace wrap and she follow-up in his office on Monday for effusion drainage and steroid injection. She may be given a anti-inflammatory as needed. She is to not weight-bear on the left knee and take it easy over the next couple of days until she is able to see him in office. Hope Pigeon, PA-C 09/22 1128    Patient's diagnosis is consistent with Left knee effusion, left knee contusion and arthritis of  left knee. Patient's left knee was placed in an Ace wrap for comfort care per orthopedic recommendations. Patient will be discharged home with prescriptions for naproxen to take as directed. Patient is to keep the left knee elevated and is not to weight-bear until seen by orthopedics in follow-up. Patient is to follow up with Dr. Rosita Kea in orthopedics on Monday for further evaluation and treatment. Patient is given ED precautions to return to the ED for any worsening or new symptoms.    ____________________________________________  FINAL CLINICAL IMPRESSION(S) / ED DIAGNOSES  Final diagnoses:  Knee effusion, left  Knee contusion, left, initial encounter  Arthritis of left knee      NEW MEDICATIONS STARTED DURING THIS VISIT:  New Prescriptions   NAPROXEN (NAPROSYN) 500 MG TABLET    Take 1 tablet (500 mg total) by mouth 2 (two) times daily with a meal.         Hope Pigeon, PA-C 06/15/16 1130    Emily Filbert, MD 06/15/16 1217

## 2016-06-15 NOTE — ED Triage Notes (Signed)
Pt states she fell on Wednesday and has been having left knee pain with swelling since.

## 2016-06-15 NOTE — ED Notes (Signed)
Patient states " I fell on Wednesday and my knee has been hurting every since. Son states " we have been applying ice and wrapping it but the swelling has not gone down. Reports increased pain with movement.

## 2016-06-15 NOTE — Discharge Instructions (Signed)
Keep the left knee elevated.   No weight bearing on the left knee and rest.   Please call Dr. Rosita KeaMenz office today to schedule an appointment for Monday.

## 2016-08-29 ENCOUNTER — Other Ambulatory Visit: Payer: Self-pay

## 2016-09-05 ENCOUNTER — Ambulatory Visit: Payer: Self-pay | Admitting: Internal Medicine

## 2017-08-29 IMAGING — CR DG KNEE COMPLETE 4+V*L*
1 series · 4 of 4 positions shown · non-contrast
Comparison: None.

CLINICAL DATA: Status post fall.  Anterior pain.

EXAM:
LEFT KNEE - COMPLETE 4+ VIEW

[Series 1: dg knee complete 4 views left · 0.14mm/px · 4 of 4 slices shown]
[im 1/4]
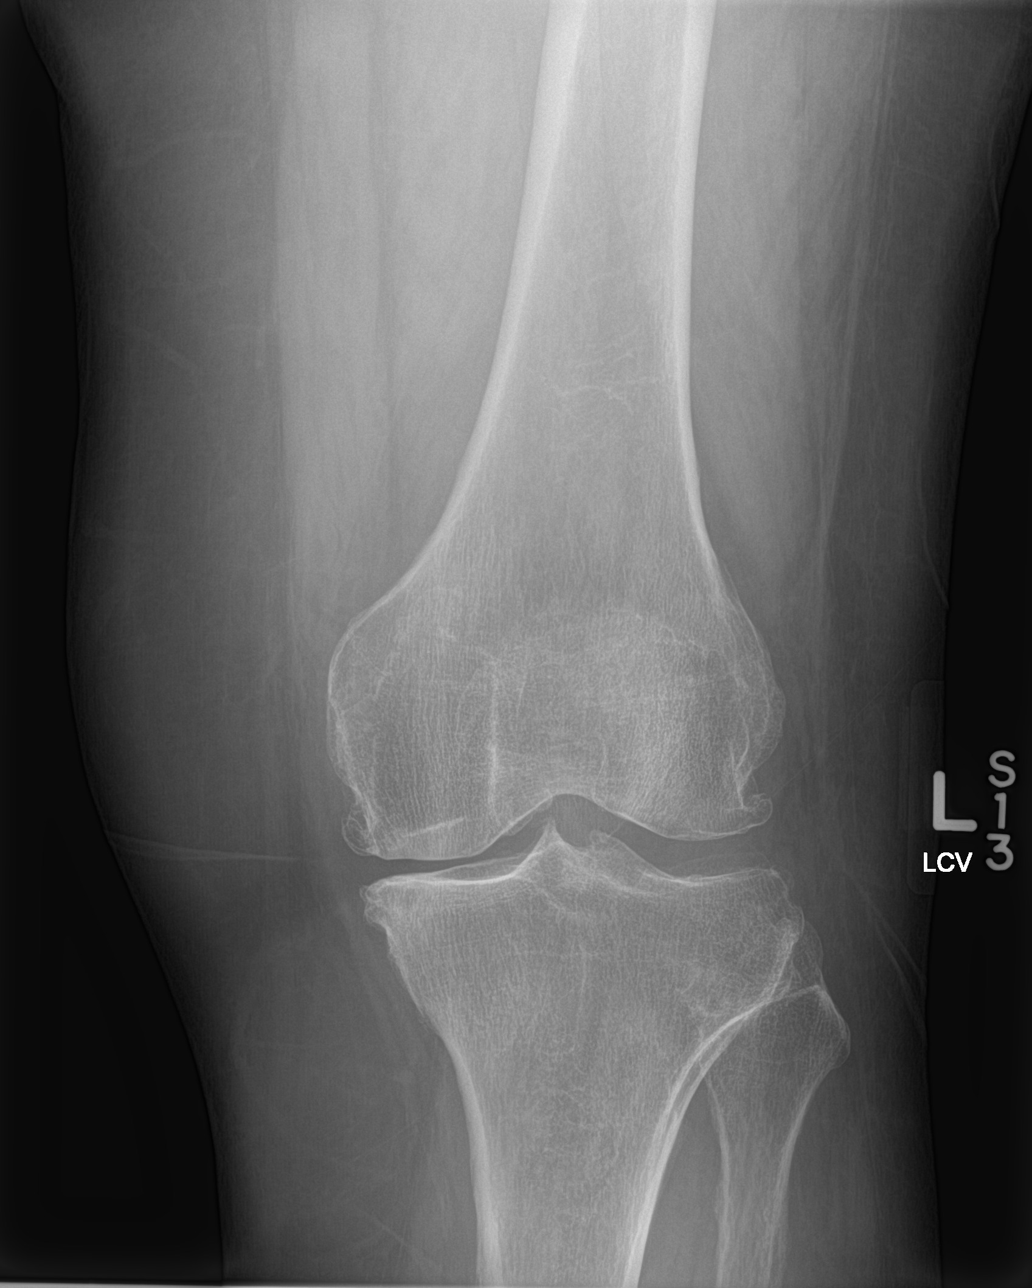
[im 2/4]
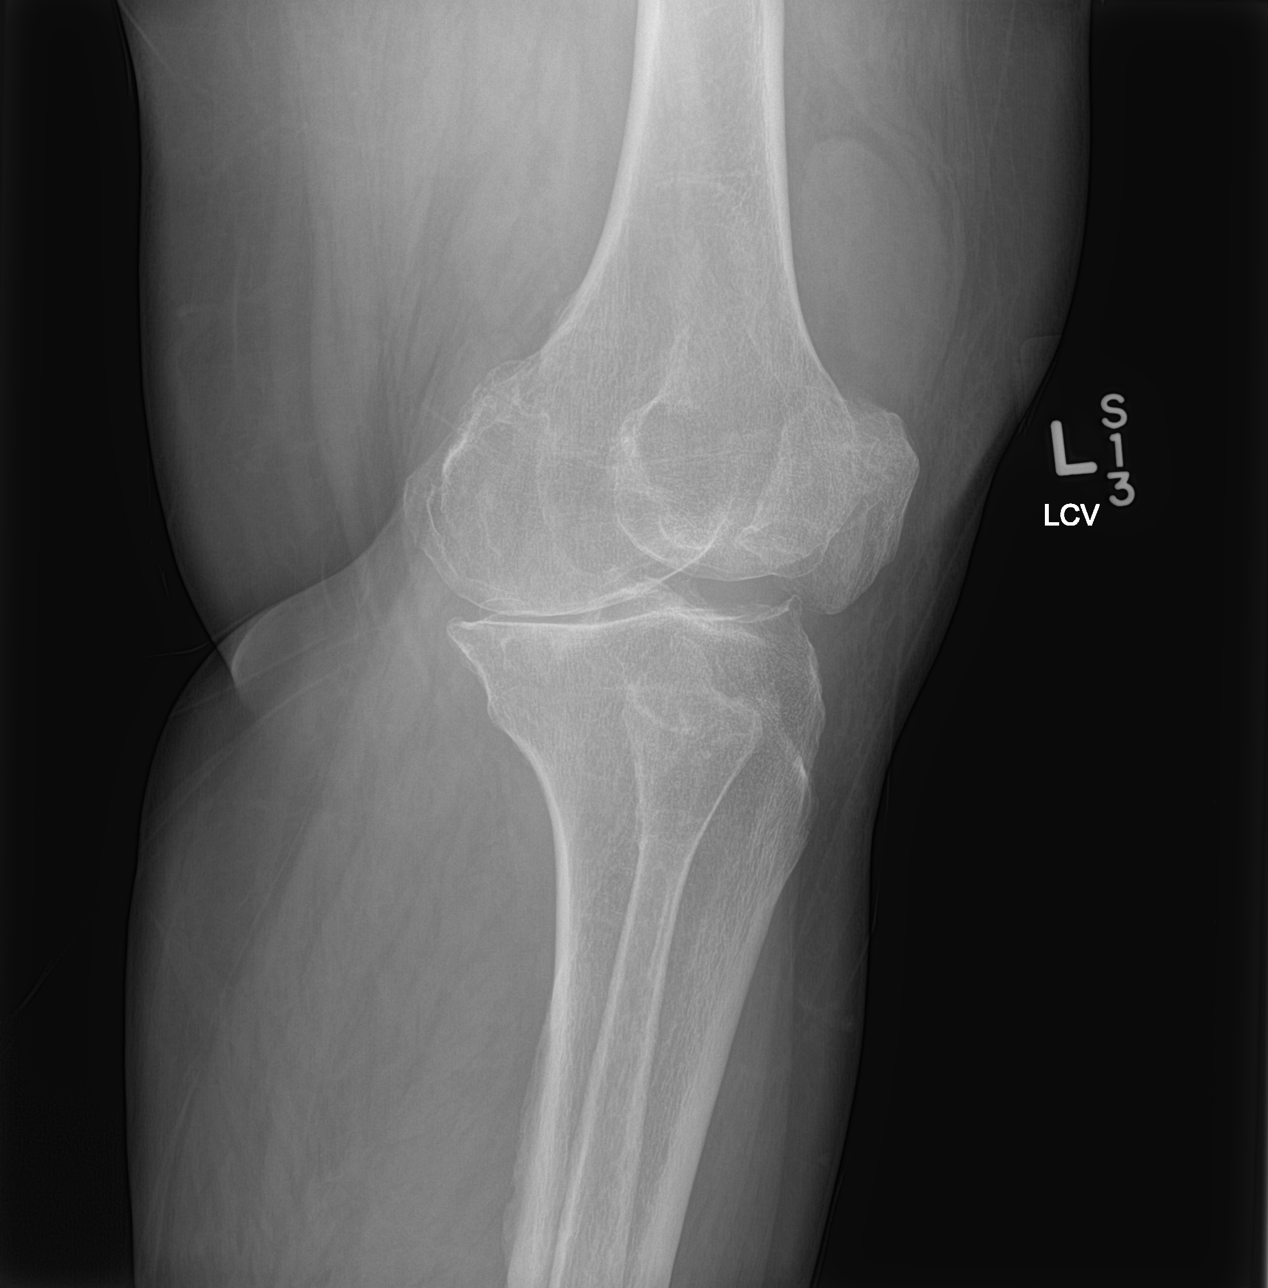
[im 3/4]
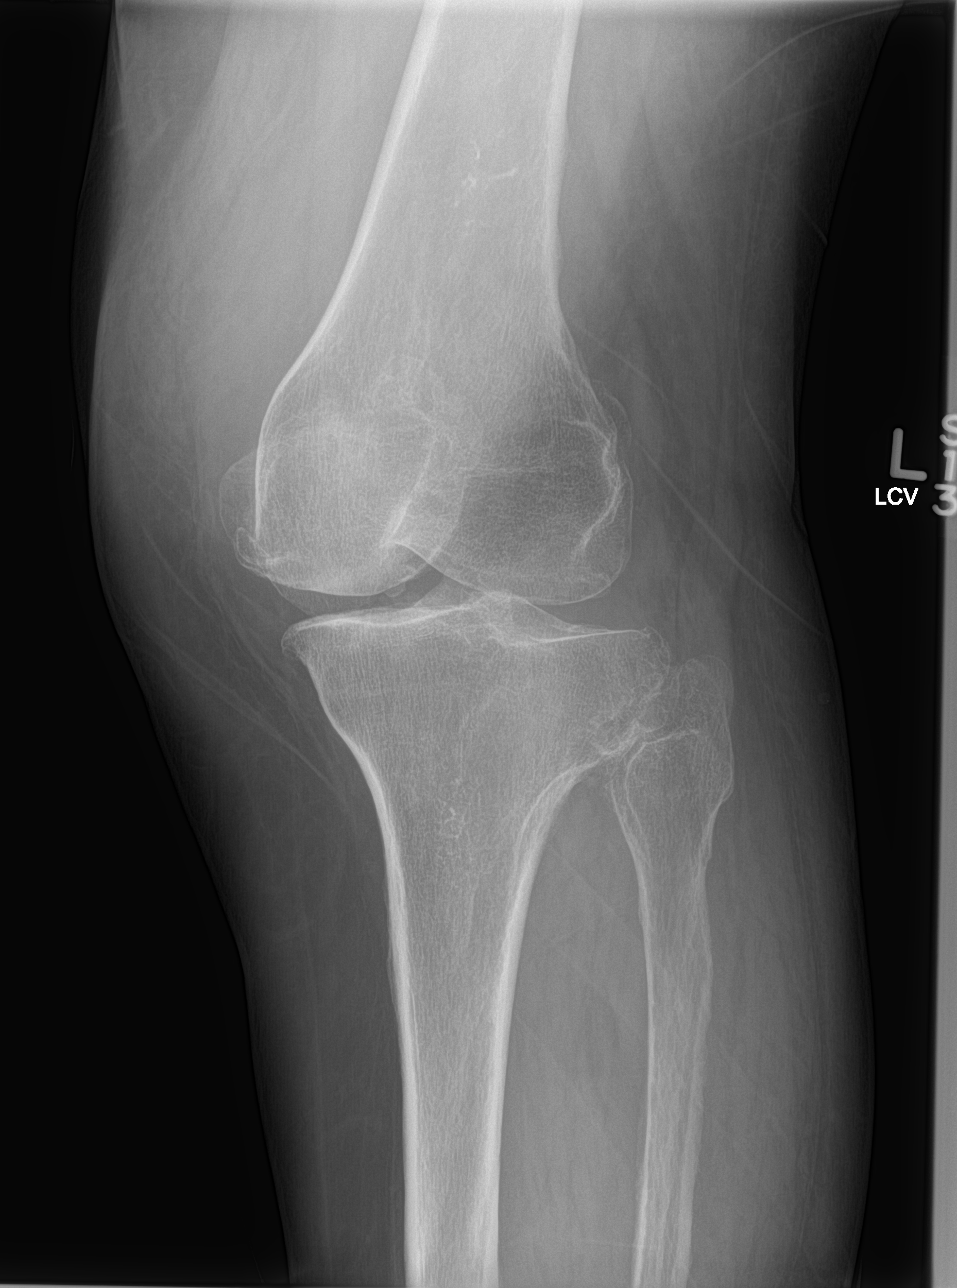
[im 4/4]
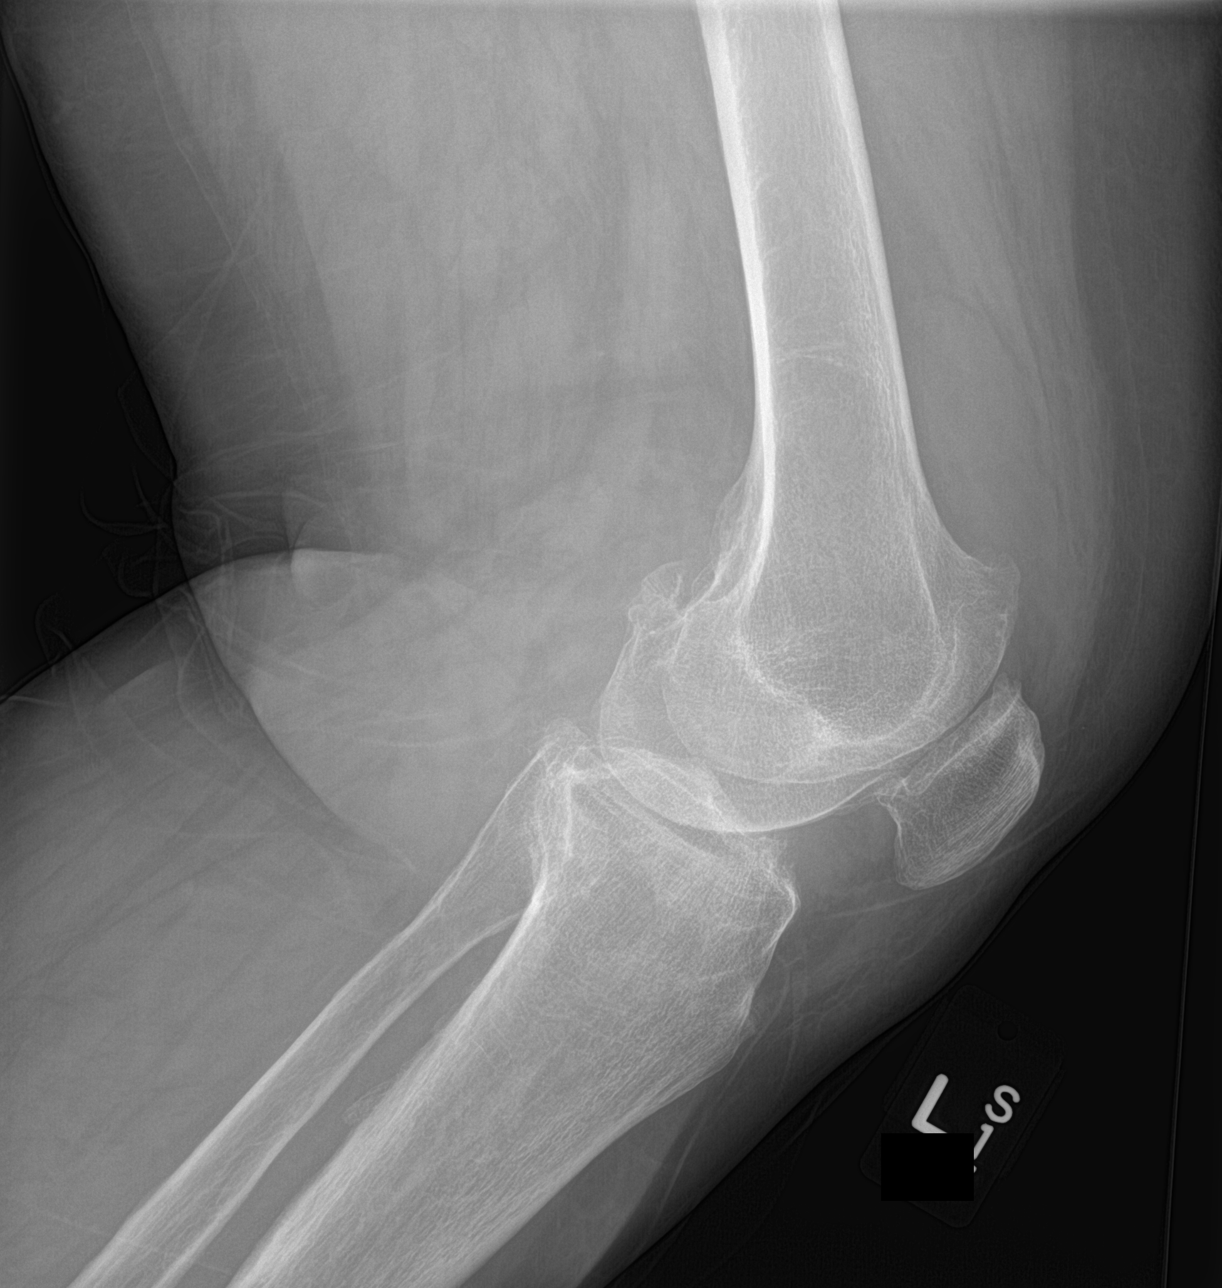

[4 of 4 positions shown; findings below may reference images not displayed]

FINDINGS: Generalized osteopenia. Mild cortical irregularity of the periphery
of the lateral tibial plateau which may reflect a osteophyte versus
possible nondisplaced fracture. Large left knee joint effusion.
Tricompartmental osteoarthritis of the left knee most severe in the
medial femorotibial compartment with mild -moderate joint space
narrowing.
IMPRESSION: Mild cortical irregularity of the periphery of the lateral tibial
plateau which may reflect a osteophyte versus possible nondisplaced
fracture. Correlate with point tenderness.

Large left knee joint effusion.

## 2018-12-23 ENCOUNTER — Other Ambulatory Visit: Payer: Self-pay

## 2018-12-23 ENCOUNTER — Emergency Department: Payer: Medicare Other

## 2018-12-23 ENCOUNTER — Emergency Department
Admission: EM | Admit: 2018-12-23 | Discharge: 2018-12-23 | Disposition: A | Discharge status: Home / Self Care | Payer: Medicare Other | Attending: Student in an Organized Health Care Education/Training Program | Admitting: Student in an Organized Health Care Education/Training Program

## 2018-12-23 ENCOUNTER — Encounter: Payer: Self-pay | Admitting: *Deleted

## 2018-12-23 DIAGNOSIS — Z79899 Other long term (current) drug therapy: Secondary | ICD-10-CM | POA: Insufficient documentation

## 2018-12-23 DIAGNOSIS — J029 Acute pharyngitis, unspecified: Secondary | ICD-10-CM | POA: Insufficient documentation

## 2018-12-23 DIAGNOSIS — R05 Cough: Secondary | ICD-10-CM | POA: Diagnosis present

## 2018-12-23 DIAGNOSIS — E119 Type 2 diabetes mellitus without complications: Secondary | ICD-10-CM | POA: Diagnosis not present

## 2018-12-23 DIAGNOSIS — Z7982 Long term (current) use of aspirin: Secondary | ICD-10-CM | POA: Insufficient documentation

## 2018-12-23 DIAGNOSIS — I10 Essential (primary) hypertension: Secondary | ICD-10-CM | POA: Diagnosis not present

## 2018-12-23 DIAGNOSIS — Z20828 Contact with and (suspected) exposure to other viral communicable diseases: Secondary | ICD-10-CM | POA: Diagnosis not present

## 2018-12-23 DIAGNOSIS — R059 Cough, unspecified: Secondary | ICD-10-CM

## 2018-12-23 LAB — URINALYSIS, COMPLETE (UACMP) WITH MICROSCOPIC
Bacteria, UA: NONE SEEN
Bilirubin Urine: NEGATIVE
Glucose, UA: NEGATIVE mg/dL
Ketones, ur: NEGATIVE mg/dL
Nitrite: NEGATIVE
Protein, ur: NEGATIVE mg/dL
Specific Gravity, Urine: 1.004 — ABNORMAL LOW (ref 1.005–1.030)
pH: 7 (ref 5.0–8.0)

## 2018-12-23 LAB — BASIC METABOLIC PANEL
Anion gap: 9 (ref 5–15)
BUN: 13 mg/dL (ref 8–23)
CO2: 26 mmol/L (ref 22–32)
Calcium: 9.5 mg/dL (ref 8.9–10.3)
Chloride: 104 mmol/L (ref 98–111)
Creatinine, Ser: 0.63 mg/dL (ref 0.44–1.00)
GFR calc Af Amer: >60 mL/min (ref >60)
GFR calc non Af Amer: >60 mL/min (ref >60)
GLUCOSE: 94 mg/dL (ref 70–99)
Potassium: 3.6 mmol/L (ref 3.5–5.1)
Sodium: 139 mmol/L (ref 135–145)

## 2018-12-23 LAB — CBC
HCT: 39.7 % (ref 36.0–46.0)
Hemoglobin: 12.8 g/dL (ref 12.0–15.0)
MCH: 29.8 pg (ref 26.0–34.0)
MCHC: 32.2 g/dL (ref 30.0–36.0)
MCV: 92.3 fL (ref 80.0–100.0)
Platelets: 425 10*3/uL — ABNORMAL HIGH (ref 150–400)
RBC: 4.3 MIL/uL (ref 3.87–5.11)
RDW: 14.6 % (ref 11.5–15.5)
WBC: 8.2 10*3/uL (ref 4.0–10.5)
nRBC: 0 % (ref 0.0–0.2)

## 2018-12-23 LAB — HEPATIC FUNCTION PANEL
ALT: 15 U/L (ref 0–44)
AST: 20 U/L (ref 15–41)
Albumin: 4 g/dL (ref 3.5–5.0)
Alkaline Phosphatase: 55 U/L (ref 38–126)
Bilirubin, Direct: <0.1 mg/dL (ref 0.0–0.2)
Total Bilirubin: 0.5 mg/dL (ref 0.3–1.2)
Total Protein: 8.1 g/dL (ref 6.5–8.1)

## 2018-12-23 MED ORDER — ACETAMINOPHEN 500 MG PO TABS
ORAL_TABLET | ORAL | Status: AC
  Filled 2018-12-23: qty 1

## 2018-12-23 MED ORDER — ACETAMINOPHEN 500 MG PO TABS
1000.0000 mg | ORAL_TABLET | Freq: Once | ORAL | Status: AC
  Administered 2018-12-23: 1000 mg via ORAL
  Filled 2018-12-23: qty 2

## 2018-12-23 NOTE — Discharge Instructions (Addendum)
You have a viral illness which can have symptoms like muscle aches, fevers, chills, runny nose, cough, sneezing, sore throat, vomiting or diarrhea. One of the viruses that can cause this is SARS- CoV-2, the virus that causes COVID-19, also known as the novel coronavirus. You could also have a different viral infection such as the common cold or flu. Most patients with viral illness including COVID-19 have mild symptoms and recover on their own. Resting, staying hydrated, and sleeping are helpful. Today we think you are well enough to go home and treat your symptoms with oral liquids, and medicine for fevers, cough, and pain. ° °We generally do not do COVID-19 testing on people with mild symptoms who are being discharged from the Emergency Department or Clinic.  ° °If we did a test for COVID-19 the results will not be available for several days. We will call you with the result. Please DO NOT CONTACT THE EMERGENCY DEPARTMENT OR CLINIC FOR RESULTS OF THIS TEST.  ° °Please follow the precautions below:  °Stay home for at least 7 days after your symptoms began OR for 3 days after your fever ends, whichever takes longer. ? ° °If people live with you they should also stay home and avoid contact with others for 14 days.? ° ° °ISOLATION GUIDANCE FOR POSSIBLE COVID °Most people with cough and fever have an illness caused by a virus. One is COVID-19. Not all people with these infections are being tested for the virus that causes COVID-19. People who might have COVID but are not being tested should still try to prevent the spread of the infection. ° °These instructions are modified recommendations from the [place] Department of Health and Human Services. ° °People who might have COVID-19 should follow the instructions below until a doctor or health department says they can stop. ° °Stay home unless you need to see a doctor °Stop doing things outside your home except for getting medical care. Do not go to work, school,  or public areas; and do not use public transportation or taxis. ° °Call ahead before visiting the doctor °Before your appointment, call the doctor's office and tell them about your symptoms. This will help them take steps to keep other people from getting infected.  ° °Keep track of your symptoms °Symptoms are the things you feel, like fever or trouble breathing. Go to your doctor or the ER if you think you are getting worse, like having more trouble breathing. Call the doctor's office and tell them about your symptoms. This will help them take steps to keep other people from getting sick.  ° °Wear a face mask °You should wear a face mask that covers your nose and mouth when you are in the same room with other people and when you visit the doctor's office. People who live with you or visit you should also wear a face mask when they are in the same room with you. ° °Separate yourself from other people in your home °You should stay in a different room from other people in your home. You should stay separate from your family members as much as possible. You should use a separate bathroom if you can. ° °Avoid sharing things in your house °Don't share dishes, drinking glasses, cups, eating utensils, towels, bedding, or other things with people in your home. After using these things please wash them really well with soap and water. ° °Cover your coughs and sneezes °Cover your mouth and nose with a tissue when you   cough or sneeze, or you can cough or sneeze into your sleeve. Throw used tissues in a trash can that has a bag in it, and immediately wash your hands with soap and water for at least 20 seconds. If you use an alcohol-based hand rub please rub your hands together for 20 seconds. ° °Wash your hands °Wash your hands often and very well with soap and water for at least 20 seconds. If your hands are not visibly dirty you can use an alcohol-based hand sanitizer. Don't touch your eyes, nose, or mouth with unwashed  hands. ° ° ° °Instructions for People Helping Care for Patients with Possible COVID-19 °Follow your doctor's instructions °Make sure that you understand and can help the patient follow any instructions for care. ° °Provide for the patient's basic needs °You should help the patient with basic needs in the home and provide support for getting groceries, prescriptions, and other personal needs. ° °Keep track of the patient's symptoms °If they are getting sicker, call his or her doctor. This will help the doctor's office take steps to keep other people from getting infected. ° °Limit the number of people who have contact with the patient °If possible, have only one caregiver for the patient. °Other family members should stay in another home or place of residence. If they can't, they should stay in another room and stay separated from the patient as much as possible. °Keep one bathroom JUST for the patient if you can.  °Only allow visitors if they MUST be in the home. ° °Keep older adults, very young children, and other sick people away from the patient °Keep older adults, very young children, and people who have compromised immune systems or chronic health conditions away from the patient. This includes people with chronic heart, lung, or kidney conditions, diabetes, and cancer. ° °Wash your hands often °Avoid touching your eyes, nose, and mouth with unwashed hands. °Wash your hands often and thoroughly with soap and water for at least 20 seconds. You can use an alcohol-based hand sanitizer if soap and water are not available and if your hands are not visibly dirty.  °Use disposable paper towels to dry your hands. If not available, use dedicated cloth towels and replace them when they become wet. ° °Avoid contamination from face masks and gloves °Wear a disposable face mask and gloves whenever you are touching the patient, things in their room or bathroom, or things that can have their body fluid on them, like bedding  or dishes, or blood, vomit, urine, or feces (poop).  °Ensure the mask fits over your nose and mouth tightly, and do not touch it at all while you are wearing it. °Throw out disposable facemasks and gloves after using them. Do not reuse. °Wash your hands immediately after removing your facemask and gloves. °If your personal clothing becomes dirty with a patient's body fluids, carefully remove clothing and launder. Wash your hands after handling dirty clothing. °Place all used disposable facemasks, gloves, and other waste in a lined container before disposing them with other household garbage.  °Remove gloves and wash your hands immediately after handling these items. ° °Do not share dishes, glasses, or other household items with the patient °Avoid sharing household items. You should not share dishes, drinking glasses, cups, eating utensils, towels, bedding, or other items with a patient who is confirmed to have, or being evaluated for, COVID-19 infection.  °After the person uses these items, you should wash them very well with soap and   water. ° °Wash laundry thoroughly °Immediately remove and wash clothes or bedding that have blood, body fluids, and/or secretions or excretions, such as sweat, saliva, sputum, nasal mucus, vomit, urine, or feces, on them.  °Wear gloves when handling laundry from the patient.  °Read and follow directions on labels of laundry or clothing items and detergent. In general, wash and dry with the warmest temperatures recommended on the label. ° °Clean all areas the individual has used  °Clean all touchable surfaces, such as counters, tabletops, doorknobs, bathroom fixtures, toilets, phones, keyboards, tablets, and bedside tables, every day. Also, clean any surfaces that may have blood, body fluids, and/or secretions or excretions on them. Wear gloves when cleaning surfaces the patient has come in contact with.  °Use a diluted bleach solution (dilute bleach with 1 part bleach and 10 parts  water) or a household disinfectant with a label that says EPA-registered for coronaviruses. To make a bleach solution at home, add 1 tablespoon of bleach to 1 quart (4 cups) of water. For a larger supply, add ¼ cup of bleach to 1 gallon (16 cups) of water.  °Read labels of cleaning products and follow recommendations provided on product labels. Labels contain instructions for safe and effective use of the cleaning product including precautions you should take when applying the product, such as wearing gloves or eye protection and making sure you have good ventilation during use of the product.  °Remove gloves and wash hands immediately after cleaning. ° °Monitor yourself for signs and symptoms of illness °Caregivers and household members are considered close contacts, should monitor their health, and will be asked to limit movement outside of the home as much as possible.  ° °If you have additional questions °Contact  °Your Doctor or Healthcare Provider °The [facility] website (http://www.[facility].edu/coronavirus) °The [facility] COVID hotline 336-70COVID °Your local health department  ° °This guidance is subject to change. For the most up to date guidance, check the state Department of Health website at [facility].GOV ° °

## 2018-12-23 NOTE — ED Triage Notes (Signed)
Pt reports she is having left arm numbness. Pt reports it feels as though her arm is asleep. Pt also reports a "dry" itchy throat, headaches intermittently and ear pain bilaterally. Pt recently lived with her family for the past month in the [facility] and returned last week. No others in her family have been sick. Pt reports she was taking care of family and never left the house. No fevers. NO CP, no SOB

## 2018-12-23 NOTE — ED Triage Notes (Signed)
Says she has itching in left arm and dry throat.  Says she had this once before and says they told her she nearly had a heart attack.  Has not been out of country, but traveled from [place] about a week ago.

## 2018-12-23 NOTE — ED Provider Notes (Addendum)
[facility] Emergency Department Provider Note    First MD Initiated Contact with Patient 12/23/18 1404     (approximate)  I have reviewed the triage vital signs and the nursing notes.   HISTORY  Chief Complaint Extremity Weakness and Sore Throat    HPI Kelly Rose is a 68 y.o. female below listed past medical history presents to the ER for evaluation of cough congestion earache muscle aches as well as some loose stools.  Patient just recently back from visiting family in the [place] 7 days ago.  Feels like she has "the flu "  she also had some tingling of her left hand yesterday but it was brief in nature and does not have any of that now.  Denies any nausea or vomiting.  No measured fevers.  Has not been on any antibiotics.   Past Medical History:  Diagnosis Date  . Depression   . Diabetes mellitus without complication (HCC)   . Hypertension    Family History  Problem Relation Age of Onset  . Hypertension Paternal Grandmother   . Cancer Neg Hx   . Depression Neg Hx    Past Surgical History:  Procedure Laterality Date  . UMBILICAL HERNIA REPAIR  1994   Patient Active Problem List   Diagnosis Date Noted  . Depression, major, recurrent, moderate (HCC) 04/30/2016  . Black stool 02/21/2016  . Abdominal pain, LLQ (left lower quadrant) 02/21/2016  . Breast cancer screening 02/21/2016  . GERD (gastroesophageal reflux disease) 11/23/2015  . Diabetes (HCC) 11/22/2015  . Hypertension 11/22/2015  . Hypercholesteremia 11/22/2015      Prior to Admission medications   Medication Sig Start Date End Date Taking? Authorizing Provider  aspirin 81 MG tablet Take 81 mg by mouth daily.    [provider]  FLUoxetine (PROZAC) 20 MG capsule Take 1 capsule (20 mg total) by mouth daily. 04/30/16   Clapacs, Jackquline Denmark, MD  glipiZIDE (GLUCOTROL) 10 MG tablet Take 1 tablet (10 mg total) by mouth 2 (two) times daily. 05/22/16   Johnson, Megan P, DO  losartan  (COZAAR) 50 MG tablet Take 1 tablet (50 mg total) by mouth daily. 05/22/16   Olevia Perches P, DO  metFORMIN (GLUCOPHAGE) 1000 MG tablet Take 1 tablet (1,000 mg total) by mouth 2 (two) times daily with a meal. 05/22/16   Johnson, Megan P, DO  metoprolol tartrate (LOPRESSOR) 25 MG tablet Take 1 tablet (25 mg total) by mouth 2 (two) times daily. 05/22/16   Johnson, Megan P, DO  naproxen (NAPROSYN) 500 MG tablet Take 1 tablet (500 mg total) by mouth 2 (two) times daily with a meal. 06/15/16   Hagler, Jami L, PA-C  omeprazole (PRILOSEC) 40 MG capsule Take 40 mg by mouth daily.    [provider]  simvastatin (ZOCOR) 20 MG tablet Take 1 tablet (20 mg total) by mouth at bedtime. 05/22/16   Olevia Perches P, DO    Allergies Lisinopril    Social History Social History   Tobacco Use  . Smoking status: Never Smoker  . Smokeless tobacco: Never Used  Substance Use Topics  . Alcohol use: No    Comment: Only socially  . Drug use: No    Review of Systems Patient denies headaches, rhinorrhea, blurry vision, numbness, shortness of breath, chest pain, edema, cough, abdominal pain, nausea, vomiting, diarrhea, dysuria, fevers, rashes or hallucinations unless otherwise stated above in HPI. ____________________________________________   PHYSICAL EXAM:  VITAL SIGNS: Vitals:   12/23/18  1500 12/23/18 1530  BP: (!) 144/80 134/72  Pulse: 69 65  Resp: 19 13  Temp:    SpO2: 98% 98%    Constitutional: Alert and oriented.  Eyes: Conjunctivae are normal.  Head: Atraumatic. Nose: No congestion/rhinnorhea. Mouth/Throat: Mucous membranes are moist.   Neck: No stridor. Painless ROM.  Cardiovascular: Normal rate, regular rhythm. Grossly normal heart sounds.  Good peripheral circulation. Respiratory: Normal respiratory effort.  No retractions. Lungs CTAB. Gastrointestinal: Soft and nontender. No distention. No abdominal bruits. No CVA tenderness. Genitourinary:  Musculoskeletal: No lower extremity  tenderness nor edema.  No joint effusions. Neurologic:  Normal speech and language. No gross focal neurologic deficits are appreciated. No facial droop Skin:  Skin is warm, dry and intact. No rash noted. Psychiatric: Mood and affect are normal. Speech and behavior are normal.  ____________________________________________   LABS (all labs ordered are listed, but only abnormal results are displayed)  Results for orders placed or performed during the hospital encounter of 12/23/18 (from the past 24 hour(s))  Urinalysis, Complete w Microscopic     Status: Abnormal   Collection Time: 12/23/18  1:40 PM  Result Value Ref Range   Color, Urine STRAW (A) YELLOW   APPearance CLEAR (A) CLEAR   Specific Gravity, Urine 1.004 (L) 1.005 - 1.030   pH 7.0 5.0 - 8.0   Glucose, UA NEGATIVE NEGATIVE mg/dL   Hgb urine dipstick SMALL (A) NEGATIVE   Bilirubin Urine NEGATIVE NEGATIVE   Ketones, ur NEGATIVE NEGATIVE mg/dL   Protein, ur NEGATIVE NEGATIVE mg/dL   Nitrite NEGATIVE NEGATIVE   Leukocytes,Ua TRACE (A) NEGATIVE   RBC / HPF 0-5 0 - 5 RBC/hpf   WBC, UA 0-5 0 - 5 WBC/hpf   Bacteria, UA NONE SEEN NONE SEEN   Squamous Epithelial / LPF 0-5 0 - 5  Basic metabolic panel     Status: None   Collection Time: 12/23/18  1:48 PM  Result Value Ref Range   Sodium 139 135 - 145 mmol/L   Potassium 3.6 3.5 - 5.1 mmol/L   Chloride 104 98 - 111 mmol/L   CO2 26 22 - 32 mmol/L   Glucose, Bld 94 70 - 99 mg/dL   BUN 13 8 - 23 mg/dL   Creatinine, Ser 1.30 0.44 - 1.00 mg/dL   Calcium 9.5 8.9 - 86.5 mg/dL   GFR calc non Af Amer >60 >60 mL/min   GFR calc Af Amer >60 >60 mL/min   Anion gap 9 5 - 15  CBC     Status: Abnormal   Collection Time: 12/23/18  1:48 PM  Result Value Ref Range   WBC 8.2 4.0 - 10.5 K/uL   RBC 4.30 3.87 - 5.11 MIL/uL   Hemoglobin 12.8 12.0 - 15.0 g/dL   HCT 78.4 69.6 - 29.5 %   MCV 92.3 80.0 - 100.0 fL   MCH 29.8 26.0 - 34.0 pg   MCHC 32.2 30.0 - 36.0 g/dL   RDW 28.4 13.2 - 44.0 %    Platelets 425 (H) 150 - 400 K/uL   nRBC 0.0 0.0 - 0.2 %  Hepatic function panel     Status: None   Collection Time: 12/23/18  1:48 PM  Result Value Ref Range   Total Protein 8.1 6.5 - 8.1 g/dL   Albumin 4.0 3.5 - 5.0 g/dL   AST 20 15 - 41 U/L   ALT 15 0 - 44 U/L   Alkaline Phosphatase 55 38 - 126 U/L   Total Bilirubin  0.5 0.3 - 1.2 mg/dL   Bilirubin, Direct <5.6 0.0 - 0.2 mg/dL   Indirect Bilirubin NOT CALCULATED 0.3 - 0.9 mg/dL   ____________________________________________ ED ECG REPORT I, Willy Eddy, the attending physician, personally viewed and interpreted this ECG.   Date: 12/23/2018  EKG Time: 13:43  Rate: 80  Rhythm: sinus  Axis: normal  Intervals: normal intervals  ST&T Change: no stemi  ____________________________________________  RADIOLOGY  I personally reviewed all radiographic images ordered to evaluate for the above acute complaints and reviewed radiology reports and findings.  These findings were personally discussed with the patient.  Please see medical record for radiology report.  ____________________________________________   PROCEDURES  Procedure(s) performed:  Procedures    Critical Care performed: no ____________________________________________   INITIAL IMPRESSION / ASSESSMENT AND PLAN / ED COURSE  Pertinent labs & imaging results that were available during my care of the patient were reviewed by me and considered in my medical decision making (see chart for details).   DDX: uri, allergies, pna, dehydration, electrolyte abn  Anjeanette Scheck Renato Gails is a 68 y.o. who presents to the ED with symptoms as described above.  Patient well-appearing nontoxic.  She is afebrile.  There is no hypoxia or respiratory distress.  Blood work sent for the above differential is reassuring.  No elevation of LFTs.  Given her exposures testing for [facility] it was initiated by do not feel she requires hospitalization.b    The patient was evaluated in Emergency Department  today for the symptoms described in the history of present illness. He/she was evaluated in the context of the global COVID-19 pandemic, which necessitated consideration that the patient might be at risk for infection with the SARS-CoV-2 virus that causes COVID-19. Institutional protocols and algorithms that pertain to the evaluation of patients at risk for COVID-19 are in a state of rapid change based on information released by regulatory bodies including the CDC and federal and state organizations. These policies and algorithms were followed during the patient's care in the ED.   As part of my medical decision making, I reviewed the following data within the electronic MEDICAL RECORD NUMBER Nursing notes reviewed and incorporated, Labs reviewed, notes from prior ED visits and [place] Controlled Substance Database   ____________________________________________   FINAL CLINICAL IMPRESSION(S) / ED DIAGNOSES  Final diagnoses:  Sore throat  Cough      NEW MEDICATIONS STARTED DURING THIS VISIT:  New Prescriptions   No medications on file     Note:  This document was prepared using Dragon voice recognition software and may include unintentional dictation errors.    Willy Eddy, MD 12/23/18 1545    Willy Eddy, MD 12/23/18 [phone]

## 2018-12-26 ENCOUNTER — Telehealth: Payer: Self-pay | Admitting: Emergency Medicine

## 2018-12-26 LAB — NOVEL CORONAVIRUS, NAA (HOSP ORDER, SEND-OUT TO REF LAB; TAT 18-24 HRS): SARS-CoV-2, NAA: NOT DETECTED

## 2018-12-26 NOTE — Telephone Encounter (Signed)
Called patient and gave covid 19 results.

## 2019-08-12 ENCOUNTER — Other Ambulatory Visit: Payer: Self-pay | Admitting: Ophthalmology

## 2019-12-07 ENCOUNTER — Other Ambulatory Visit: Payer: Self-pay | Admitting: Family Medicine

## 2019-12-07 DIAGNOSIS — R1012 Left upper quadrant pain: Secondary | ICD-10-CM

## 2019-12-11 ENCOUNTER — Other Ambulatory Visit: Payer: Self-pay

## 2019-12-11 ENCOUNTER — Ambulatory Visit
Admission: RE | Admit: 2019-12-11 | Discharge: 2019-12-11 | Disposition: A | Discharge status: Home / Self Care | Payer: Medicare HMO | Source: Ambulatory Visit | Attending: Family Medicine | Admitting: Family Medicine

## 2019-12-11 DIAGNOSIS — R1012 Left upper quadrant pain: Secondary | ICD-10-CM

## 2019-12-24 ENCOUNTER — Encounter: Payer: Self-pay | Admitting: Skilled Nursing Facility1

## 2019-12-24 ENCOUNTER — Other Ambulatory Visit: Payer: Self-pay

## 2019-12-24 ENCOUNTER — Encounter: Payer: Medicare HMO | Attending: Family Medicine | Admitting: Skilled Nursing Facility1

## 2019-12-24 DIAGNOSIS — E119 Type 2 diabetes mellitus without complications: Secondary | ICD-10-CM | POA: Insufficient documentation

## 2019-12-24 DIAGNOSIS — Z713 Dietary counseling and surveillance: Secondary | ICD-10-CM | POA: Insufficient documentation

## 2019-12-24 NOTE — Progress Notes (Signed)
  Assessment:  Primary concerns today: fatty liver   Pt states due to metformin messing up her stomach she is now taking jardiance.  Pt states she takes protonix daily.  Pt states she has had diabetes for many years. Pt states she was 340 pounds. Pt states in one month she lost 90 pounds.  Pts significant other states she has a fatty liver.  Pt states her A1C is 8 and states it was always 7 something before that. Pt states she never feels shaky or sweaty but she is tired a lot.  Pt states she checks her blood sugar every morning fasting: 120, 113. Pt states she also checks 2 hours after her lunch 113 on average.  Pt states she checks her feet every day.  Pt states she has a normal bowel movement every day. Pt states she does not sleep well unable to get rest.  Pt states she was snacking a lot due to being stuck at home from the pandemic so she has been reading and drinking water instead of snacking.  Pt states since her diagnoses of fatty liver she has changed a lot: stopped eating late at night, stopped calorically dense snacking, smaller portion sizes, and no juice or caffeinated coffee.   Pts significant other asked apt questions and is very knowledgeable.   MEDICATIONS: glipizide, jardiance    DIETARY INTAKE:  Usual eating pattern includes 3 meals and 1 snacks per day.  Everyday foods include none stated.  Avoided foods include candy.    24-hr recall:  B ( AM): oatmeal with brown sugar Snk ( AM):  L ( PM): salad with chicken  Snk ( PM):  D ( PM): chicken + rice + beans + salad  Snk ( PM):  Beverages: decaf coffee, water  Usual physical activity: ADL's    Intervention:  Nutrition counseling for a balanced diet within the context of weight management, diabetes,and and fatty liver. Goals: Finger prick=the last few hours A1C=the last few months, a percent The number you check in the morning before you eat should be 130 or lower and when you check 2 hours after you eat you want  under 180  Teaching Method Utilized:  Visual Auditory Hands on  Handouts given during visit include:  In spanish: recipes, heart health, and type 2 diabetes explained   Barriers to learning/adherence to lifestyle change: none identified   Demonstrated degree of understanding via:  Teach Back   Monitoring/Evaluation:  Dietary intake, exercise, and body weight prn.

## 2019-12-24 NOTE — Patient Instructions (Addendum)
Finger prick=the last few hours  A1C=the last few months, a percent  The number you check in the morning before you eat should be 130 or lower and when you check 2 hours after you eat you want under 180

## 2020-01-02 ENCOUNTER — Ambulatory Visit: Payer: Medicare HMO

## 2020-01-04 ENCOUNTER — Other Ambulatory Visit: Payer: Self-pay

## 2020-01-04 ENCOUNTER — Ambulatory Visit: Payer: Medicare HMO | Attending: Internal Medicine

## 2020-01-04 DIAGNOSIS — Z23 Encounter for immunization: Secondary | ICD-10-CM

## 2020-01-04 NOTE — Progress Notes (Signed)
   Covid-19 Vaccination Clinic  Name:  Kelly Rose    MRN: [id number] DOB: 1951/08/07  01/04/2020  [name] was observed post Covid-19 immunization for 15 minutes without incident. She was provided with Vaccine Information Sheet and instruction to access the V-Safe system.   [name] was instructed to call 911 with any severe reactions post vaccine: Marland Kitchen Difficulty breathing  . Swelling of face and throat  . A fast heartbeat  . A bad rash all over body  . Dizziness and weakness   Immunizations Administered    Name Date Dose VIS Date Route   Pfizer COVID-19 Vaccine 01/04/2020  9:48 AM 0.3 mL 09/04/2019 Intramuscular   Manufacturer: [employer], [employer]   Lot: [id number]   NDC: [address]-5

## 2020-01-26 ENCOUNTER — Ambulatory Visit: Payer: Medicare HMO | Attending: Internal Medicine

## 2020-01-26 DIAGNOSIS — Z23 Encounter for immunization: Secondary | ICD-10-CM

## 2020-01-26 NOTE — Progress Notes (Signed)
   Covid-19 Vaccination Clinic  Name:  Kelly Rose    MRN: [id number] DOB: 1950/11/21  01/26/2020  [name] was observed post Covid-19 immunization for 15 minutes without incident. She was provided with Vaccine Information Sheet and instruction to access the V-Safe system.   [name] was instructed to call 911 with any severe reactions post vaccine: Marland Kitchen Difficulty breathing  . Swelling of face and throat  . A fast heartbeat  . A bad rash all over body  . Dizziness and weakness   Immunizations Administered    Name Date Dose VIS Date Route   Pfizer COVID-19 Vaccine 01/26/2020  1:45 PM 0.3 mL 11/18/2018 Intramuscular   Manufacturer: [employer], [employer]   Lot: [id number]   NDC: [address]-8

## 2021-10-09 ENCOUNTER — Other Ambulatory Visit: Payer: Self-pay | Admitting: Orthopedic Surgery

## 2021-10-09 DIAGNOSIS — M1711 Unilateral primary osteoarthritis, right knee: Secondary | ICD-10-CM

## 2021-10-11 ENCOUNTER — Other Ambulatory Visit: Payer: Self-pay

## 2021-10-11 ENCOUNTER — Ambulatory Visit
Admission: EM | Admit: 2021-10-11 | Discharge: 2021-10-11 | Disposition: A | Discharge status: Home / Self Care | Payer: Medicare Other

## 2021-10-11 DIAGNOSIS — U071 COVID-19: Secondary | ICD-10-CM

## 2021-10-11 MED ORDER — MOLNUPIRAVIR EUA 200MG CAPSULE: 4.0000 | ORAL_CAPSULE | Freq: Two times a day (BID) | ORAL | 0 refills | Status: AC

## 2021-10-11 MED ORDER — BENZONATATE 100 MG PO CAPS: 200.0000 mg | ORAL_CAPSULE | Freq: Three times a day (TID) | ORAL | 0 refills | Status: DC

## 2021-10-11 MED ORDER — PROMETHAZINE-DM 6.25-15 MG/5ML PO SYRP: 5.0000 mL | ORAL_SOLUTION | Freq: Four times a day (QID) | ORAL | 0 refills | Status: DC | PRN

## 2021-10-11 MED ORDER — IPRATROPIUM BROMIDE 0.06 % NA SOLN: 2.0000 | Freq: Four times a day (QID) | NASAL | 12 refills | Status: AC

## 2021-10-11 NOTE — ED Provider Notes (Signed)
[facility]    CSN: [id number] Arrival date & time: 10/11/21  1907      History   Chief Complaint Chief Complaint  Patient presents with   COVID +    HPI Kelly Rose is a 71 y.o. female.   HPI  71 year old female here for evaluation of respiratory symptoms.  Patient is here with her son for evaluation of headache, body aches, sore throat, runny nose and nasal congestion for clear nasal discharge, left ear pain, nonproductive cough, and chills that started 3 days ago.  She has not had a fever, shortness of breath or wheezing, or GI complaints.  Past Medical History:  Diagnosis Date   Depression    Diabetes mellitus without complication ([place])    Hypertension     Patient Active Problem List   Diagnosis Date Noted   Depression, major, recurrent, moderate ([place]) 04/30/2016   Black stool 02/21/2016   Abdominal pain, LLQ (left lower quadrant) 02/21/2016   Breast cancer screening 02/21/2016   GERD (gastroesophageal reflux disease) 11/23/2015   Diabetes ([place]) 11/22/2015   Hypertension 11/22/2015   Hypercholesteremia 11/22/2015    Past Surgical History:  Procedure Laterality Date   [place]    OB History   No obstetric history on file.      Home Medications    Prior to Admission medications   Medication Sig Start Date End Date Taking? Authorizing Provider  aspirin 81 MG tablet Take 81 mg by mouth daily.   Yes [provider]  benzonatate (TESSALON) 100 MG capsule Take 2 capsules (200 mg total) by mouth every 8 (eight) hours. 10/11/21  Yes Margarette Canada, NP  empagliflozin (JARDIANCE) 10 MG TABS tablet Take by mouth daily.   Yes [provider]  ipratropium (ATROVENT) 0.06 % nasal spray Place 2 sprays into both nostrils 4 (four) times daily. 10/11/21  Yes Margarette Canada, NP  losartan (COZAAR) 50 MG tablet Take 1 tablet (50 mg total) by mouth daily. 05/22/16  Yes Johnson, Megan P, DO  metoprolol tartrate (LOPRESSOR) 25 MG  tablet Take 1 tablet (25 mg total) by mouth 2 (two) times daily. 05/22/16  Yes Johnson, Megan P, DO  molnupiravir EUA (LAGEVRIO) 200 mg CAPS capsule Take 4 capsules (800 mg total) by mouth 2 (two) times daily for 5 days. 10/11/21 10/16/21 Yes Margarette Canada, NP  promethazine-dextromethorphan (PROMETHAZINE-DM) 6.25-15 MG/5ML syrup Take 5 mLs by mouth 4 (four) times daily as needed. 10/11/21  Yes Margarette Canada, NP  simvastatin (ZOCOR) 20 MG tablet Take 1 tablet (20 mg total) by mouth at bedtime. 05/22/16  Yes Johnson, Barb Merino, DO    Family History Family History  Problem Relation Age of Onset   Hypertension Paternal Grandmother    Cancer Neg Hx    Depression Neg Hx     Social History Social History   Tobacco Use   Smoking status: Never   Smokeless tobacco: Never  Vaping Use   Vaping Use: Never used  Substance Use Topics   Alcohol use: No    Comment: Only socially   Drug use: No     Allergies   Lisinopril and Metformin and related   Review of Systems Review of Systems  Constitutional:  Positive for fatigue. Negative for activity change, appetite change and fever.  HENT:  Positive for congestion, ear pain, rhinorrhea and sore throat.   Respiratory:  Positive for cough. Negative for shortness of breath and wheezing.   Gastrointestinal:  Negative for diarrhea,  nausea and vomiting.  Musculoskeletal:  Positive for arthralgias and myalgias.  Skin:  Negative for rash.  Neurological:  Positive for headaches.  Hematological: Negative.   Psychiatric/Behavioral: Negative.      Physical Exam Triage Vital Signs ED Triage Vitals [10/11/21 1923]  Enc Vitals Group     BP 139/67     Pulse      Resp 18     Temp 99.1 F (37.3 C)     Temp Source Oral     SpO2 97 %     Weight 194 lb (88 kg)     Height 5\' 7"  (1.702 m)     Head Circumference      Peak Flow      Pain Score 0     Pain Loc      Pain Edu?      Excl. in [place]?    No data found.  Updated Vital Signs BP 139/67 (BP Location:  Left Arm)    Temp 99.1 F (37.3 C) (Oral)    Resp 18    Ht 5\' 7"  (1.702 m)    Wt 194 lb (88 kg)    SpO2 97%    BMI 30.38 kg/m   Visual Acuity Right Eye Distance:   Left Eye Distance:   Bilateral Distance:    Right Eye Near:   Left Eye Near:    Bilateral Near:     Physical Exam Vitals and nursing note reviewed.  Constitutional:      General: She is not in acute distress.    Appearance: Normal appearance. She is not ill-appearing.  HENT:     Head: Normocephalic and atraumatic.     Right Ear: Tympanic membrane, ear canal and external ear normal. There is no impacted cerumen.     Left Ear: Tympanic membrane, ear canal and external ear normal. There is no impacted cerumen.     Nose: Congestion and rhinorrhea present.     Mouth/Throat:     Mouth: Mucous membranes are moist.     Pharynx: Oropharynx is clear. Posterior oropharyngeal erythema present.  Cardiovascular:     Rate and Rhythm: Normal rate and regular rhythm.     Pulses: Normal pulses.     Heart sounds: Normal heart sounds. No murmur heard.   No friction rub. No gallop.  Pulmonary:     Effort: Pulmonary effort is normal.     Breath sounds: Normal breath sounds. No wheezing, rhonchi or rales.  Musculoskeletal:     Cervical back: Normal range of motion and neck supple.  Lymphadenopathy:     Cervical: No cervical adenopathy.  Skin:    General: Skin is warm and dry.     Capillary Refill: Capillary refill takes less than 2 seconds.     Findings: No erythema or rash.  Neurological:     General: No focal deficit present.     Mental Status: She is alert and oriented to person, place, and time.  Psychiatric:        Mood and Affect: Mood normal.        Behavior: Behavior normal.        Thought Content: Thought content normal.        Judgment: Judgment normal.     UC Treatments / Results  Labs (all labs ordered are listed, but only abnormal results are displayed) Labs Reviewed - No data to  display  EKG   Radiology No results found.  Procedures Procedures (including critical care time)  Medications  Ordered in UC Medications - No data to display  Initial Impression / Assessment and Plan / UC Course  I have reviewed the triage vital signs and the nursing notes.  Pertinent labs & imaging results that were available during my care of the patient were reviewed by me and considered in my medical decision making (see chart for details).  Patient is a very pleasant, nontoxic-appearing 71 year old female here for evaluation of respiratory symptoms that started 3 days ago as outlined in HPI above.  Patient took a home COVID test 2 days ago that was positive.  She has a history of diabetes and high blood pressure so she came in for evaluation.  Her physical exam reveals protegrin tympanic membranes bilaterally with normal light reflex and clear external auditory canals.  Nasal mucosa is erythematous edematous with clear discharge in both nares.  Oropharyngeal exam reveals mild posterior oropharyngeal erythema with clear postnasal drip.  No cervical adenopathy appreciated on exam.  Cardiopulmonary exam reveals clear lung sounds in all fields.  I will treat patient with molnupiravir twice daily for 5 days for treatment of her COVID 19.  I will also prescribe Atrovent nasal spray to help with the nasal congestion.  She can have 2 squirts up each nostril every 6 hours as needed.  For cough and congestion I have prescribed Tessalon Perles for use during the day and Promethazine DM cough syrup use at bedtime.  ER and return precautions reviewed with patient and family.   Final Clinical Impressions(s) / UC Diagnoses   Final diagnoses:  [id number]     Discharge Instructions      You will have to quarantine for 5 days from the start of your symptoms.  After 5 days you can break quarantine if your symptoms have improved and you have not had a fever for 24 hours without taking Tylenol or  ibuprofen.  Use over-the-counter Tylenol and ibuprofen as needed for body aches and fever.  Use the Atrovent nasal spray, 2 squirts in each nostril every 6 hours, as needed for runny nose and postnasal drip.  Use the Tessalon Perles every 8 hours during the day.  Take them with a small sip of water.  They may give you some numbness to the base of your tongue or a metallic taste in your mouth, this is normal.  Use the Promethazine DM cough syrup at bedtime for cough and congestion.  It will make you drowsy so do not take it during the day.  Take the molnupiravir twice daily for 5 days for treatment of COVID-19.  If you develop any increased shortness of breath-especially at rest, you are unable to speak in full sentences, or is a late sign your lips are turning blue you need to go the ER for evaluation.      ED Prescriptions     Medication Sig Dispense Auth. Provider   molnupiravir EUA (LAGEVRIO) 200 mg CAPS capsule Take 4 capsules (800 mg total) by mouth 2 (two) times daily for 5 days. 40 capsule Margarette Canada, NP   benzonatate (TESSALON) 100 MG capsule Take 2 capsules (200 mg total) by mouth every 8 (eight) hours. 21 capsule Margarette Canada, NP   ipratropium (ATROVENT) 0.06 % nasal spray Place 2 sprays into both nostrils 4 (four) times daily. 15 mL Margarette Canada, NP   promethazine-dextromethorphan (PROMETHAZINE-DM) 6.25-15 MG/5ML syrup Take 5 mLs by mouth 4 (four) times daily as needed. 118 mL Margarette Canada, NP      PDMP not reviewed  this encounter.   Margarette Canada, NP 10/11/21 2100

## 2021-10-11 NOTE — Discharge Instructions (Signed)
You will have to quarantine for 5 days from the start of your symptoms.  After 5 days you can break quarantine if your symptoms have improved and you have not had a fever for 24 hours without taking Tylenol or ibuprofen.  Use over-the-counter Tylenol and ibuprofen as needed for body aches and fever.  Use the Atrovent nasal spray, 2 squirts in each nostril every 6 hours, as needed for runny nose and postnasal drip.  Use the Tessalon Perles every 8 hours during the day.  Take them with a small sip of water.  They may give you some numbness to the base of your tongue or a metallic taste in your mouth, this is normal.  Use the Promethazine DM cough syrup at bedtime for cough and congestion.  It will make you drowsy so do not take it during the day.  Take the molnupiravir twice daily for 5 days for treatment of COVID-19  If you develop any increased shortness of breath-especially at rest, you are unable to speak in full sentences, or is a late sign your lips are turning blue you need to go the ER for evaluation.  

## 2021-10-11 NOTE — ED Triage Notes (Signed)
Pt reports cough started Sunday, chills, sore throat, headache, nasal congestion to follow.  Home COVID test + Monday  Pt's son here to help with communication

## 2021-10-25 ENCOUNTER — Ambulatory Visit
Admission: RE | Admit: 2021-10-25 | Discharge: 2021-10-25 | Disposition: A | Discharge status: Home / Self Care | Payer: Medicare Other | Source: Ambulatory Visit | Attending: Orthopedic Surgery | Admitting: Orthopedic Surgery

## 2021-10-25 ENCOUNTER — Other Ambulatory Visit: Payer: Self-pay

## 2021-10-25 DIAGNOSIS — M1711 Unilateral primary osteoarthritis, right knee: Secondary | ICD-10-CM | POA: Insufficient documentation

## 2021-11-17 ENCOUNTER — Other Ambulatory Visit: Payer: Self-pay | Admitting: Orthopedic Surgery

## 2021-11-29 ENCOUNTER — Other Ambulatory Visit: Payer: Self-pay

## 2021-11-29 ENCOUNTER — Emergency Department: Payer: Medicare Other

## 2021-11-29 ENCOUNTER — Emergency Department
Admission: EM | Admit: 2021-11-29 | Discharge: 2021-11-29 | Disposition: A | Discharge status: Home / Self Care | Payer: Medicare Other | Attending: Emergency Medicine | Admitting: Emergency Medicine

## 2021-11-29 DIAGNOSIS — Z20822 Contact with and (suspected) exposure to covid-19: Secondary | ICD-10-CM | POA: Diagnosis not present

## 2021-11-29 DIAGNOSIS — Z79899 Other long term (current) drug therapy: Secondary | ICD-10-CM | POA: Diagnosis not present

## 2021-11-29 DIAGNOSIS — I493 Ventricular premature depolarization: Secondary | ICD-10-CM

## 2021-11-29 DIAGNOSIS — I10 Essential (primary) hypertension: Secondary | ICD-10-CM | POA: Insufficient documentation

## 2021-11-29 DIAGNOSIS — R002 Palpitations: Secondary | ICD-10-CM | POA: Diagnosis not present

## 2021-11-29 DIAGNOSIS — E119 Type 2 diabetes mellitus without complications: Secondary | ICD-10-CM | POA: Diagnosis not present

## 2021-11-29 DIAGNOSIS — Z7982 Long term (current) use of aspirin: Secondary | ICD-10-CM | POA: Insufficient documentation

## 2021-11-29 DIAGNOSIS — J029 Acute pharyngitis, unspecified: Secondary | ICD-10-CM

## 2021-11-29 LAB — RESP PANEL BY RT-PCR (FLU A&B, COVID) ARPGX2
Influenza A by PCR: NEGATIVE
Influenza B by PCR: NEGATIVE
SARS Coronavirus 2 by RT PCR: NEGATIVE

## 2021-11-29 LAB — CBC WITH DIFFERENTIAL/PLATELET
Abs Immature Granulocytes: 0.01 10*3/uL (ref 0.00–0.07)
Basophils Absolute: 0.1 10*3/uL (ref 0.0–0.1)
Basophils Relative: 1 %
Eosinophils Absolute: 0.3 10*3/uL (ref 0.0–0.5)
Eosinophils Relative: 4 %
HCT: 38.9 % (ref 36.0–46.0)
Hemoglobin: 12.5 g/dL (ref 12.0–15.0)
Immature Granulocytes: 0 %
Lymphocytes Relative: 34 %
Lymphs Abs: 2.2 10*3/uL (ref 0.7–4.0)
MCH: 31.4 pg (ref 26.0–34.0)
MCHC: 32.1 g/dL (ref 30.0–36.0)
MCV: 97.7 fL (ref 80.0–100.0)
Monocytes Absolute: 0.5 10*3/uL (ref 0.1–1.0)
Monocytes Relative: 8 %
Neutro Abs: 3.4 10*3/uL (ref 1.7–7.7)
Neutrophils Relative %: 53 %
Platelets: 367 10*3/uL (ref 150–400)
RBC: 3.98 MIL/uL (ref 3.87–5.11)
RDW: 14.5 % (ref 11.5–15.5)
WBC: 6.6 10*3/uL (ref 4.0–10.5)
nRBC: 0 % (ref 0.0–0.2)

## 2021-11-29 LAB — COMPREHENSIVE METABOLIC PANEL
ALT: 13 U/L (ref 0–44)
AST: 17 U/L (ref 15–41)
Albumin: 3.9 g/dL (ref 3.5–5.0)
Alkaline Phosphatase: 54 U/L (ref 38–126)
Anion gap: 7 (ref 5–15)
BUN: 7 mg/dL — ABNORMAL LOW (ref 8–23)
CO2: 28 mmol/L (ref 22–32)
Calcium: 9.5 mg/dL (ref 8.9–10.3)
Chloride: 106 mmol/L (ref 98–111)
Creatinine, Ser: 0.66 mg/dL (ref 0.44–1.00)
GFR, Estimated: >60 mL/min (ref >60)
Glucose, Bld: 105 mg/dL — ABNORMAL HIGH (ref 70–99)
Potassium: 3.8 mmol/L (ref 3.5–5.1)
Sodium: 141 mmol/L (ref 135–145)
Total Bilirubin: 0.6 mg/dL (ref 0.3–1.2)
Total Protein: 7.6 g/dL (ref 6.5–8.1)

## 2021-11-29 LAB — MAGNESIUM: Magnesium: 2.2 mg/dL (ref 1.7–2.4)

## 2021-11-29 LAB — TROPONIN I (HIGH SENSITIVITY): Troponin I (High Sensitivity): 4 ng/L (ref <18)

## 2021-11-29 LAB — GROUP A STREP BY PCR: Group A Strep by PCR: NOT DETECTED

## 2021-11-29 LAB — D-DIMER, QUANTITATIVE: D-Dimer, Quant: <0.27 ug/mL-FEU (ref 0.00–0.50)

## 2021-11-29 LAB — TSH: TSH: 0.991 u[IU]/mL (ref 0.350–4.500)

## 2021-11-29 MED ORDER — IBUPROFEN 800 MG PO TABS
800.0000 mg | ORAL_TABLET | Freq: Once | ORAL | Status: DC
Start: 2021-11-29 — End: 2021-11-30
  Filled 2021-11-29: qty 1

## 2021-11-29 NOTE — Discharge Instructions (Addendum)
You may take Tylenol 1000 mg every 6 hours as needed for pain. °

## 2021-11-29 NOTE — ED Provider Notes (Signed)
[facility] Provider Note    Event Date/Time   First MD Initiated Contact with Patient 11/29/21 1759     (approximate)   History   Palpitations and Sore Throat   HPI  Kelly Rose is a 71 y.o. female with history of hypertension, diabetes who presents to the emergency department palpitations for the past couple of days.  She states she feels like her heart is racing.  No chest pain or shortness of breath but has had some mobilization issues due to right knee pain and is scheduled to have surgery on the 23rd.  Denies history of PE, DVT, CAD.  States she drinks caffeine but not every day.  No other stimulant use or illicit drug use.  Also complaining of some burning left-sided throat pain but no tightness or pressure.  She thought it was allergies.  No fevers, cough.  Denies nausea, vomiting, diaphoresis or dizziness.  History provided by patient and son.    Past Medical History:  Diagnosis Date   Depression    Diabetes mellitus without complication (HCC)    Hypertension     Past Surgical History:  Procedure Laterality Date   UMBILICAL HERNIA REPAIR  1994    MEDICATIONS:  Prior to Admission medications   Medication Sig Start Date End Date Taking? Authorizing Provider  aspirin 81 MG tablet Take 81 mg by mouth daily.    [provider]  benzonatate (TESSALON) 100 MG capsule Take 2 capsules (200 mg total) by mouth every 8 (eight) hours. Patient not taking: Reported on 11/24/2021 10/11/21   Becky Augusta, NP  ipratropium (ATROVENT) 0.06 % nasal spray Place 2 sprays into both nostrils 4 (four) times daily. Patient taking differently: Place 1 spray into both nostrils daily as needed for rhinitis (allergies). 10/11/21   Becky Augusta, NP  losartan (COZAAR) 50 MG tablet Take 1 tablet (50 mg total) by mouth daily. 05/22/16   Johnson, Megan P, DO  Melatonin 10 MG TABS Take 10 mg by mouth at bedtime as needed (sleep).    [provider]  metoprolol  succinate (TOPROL-XL) 25 MG 24 hr tablet Take 25 mg by mouth daily as needed (High Blood Pressure). 10/12/21   [provider]  Multiple Vitamins-Minerals (MULTIVITAMIN WITH MINERALS) tablet Take 1 tablet by mouth daily. Centrum for Women    [provider]  pantoprazole (PROTONIX) 40 MG tablet Take 40 mg by mouth daily as needed (acid reflux). 11/05/21   [provider]  promethazine-dextromethorphan (PROMETHAZINE-DM) 6.25-15 MG/5ML syrup Take 5 mLs by mouth 4 (four) times daily as needed. Patient not taking: Reported on 11/24/2021 10/11/21   Becky Augusta, NP  Semaglutide (RYBELSUS) 7 MG TABS Take 14 mg by mouth daily.    [provider]  simvastatin (ZOCOR) 20 MG tablet Take 1 tablet (20 mg total) by mouth at bedtime. 05/22/16   Dorcas Carrow, DO    Physical Exam   Triage Vital Signs: ED Triage Vitals  Enc Vitals Group     BP 11/29/21 1724 (!) 116/92     Pulse Rate 11/29/21 1724 78     Resp 11/29/21 1724 18     Temp 11/29/21 1724 98 F (36.7 C)     Temp Source 11/29/21 1724 Oral     SpO2 11/29/21 1724 100 %     Weight --      Height --      Head Circumference --      Peak Flow --  Pain Score 11/29/21 1725 7     Pain Loc --      Pain Edu? --      Excl. in [place]? --     Most recent vital signs: Vitals:   11/29/21 1937 11/29/21 1945  BP: 122/65 124/67  Pulse: 69 67  Resp: 18 18  Temp:  98.2 F (36.8 C)  SpO2: 100% 100%    CONSTITUTIONAL: Alert and oriented and responds appropriately to questions. Well-appearing; well-nourished HEAD: Normocephalic, atraumatic EYES: Conjunctivae clear, pupils appear equal, sclera nonicteric ENT: normal nose; moist mucous membranes; No pharyngeal erythema or petechiae, no tonsillar hypertrophy or exudate, no uvular deviation, no unilateral swelling in posterior oropharynx, no trismus or drooling, no muffled voice, normal phonation, no stridor, airway patent. NECK: Supple, normal ROM, trachea midline, no  thyromegaly, no cervical lymphadenopathy CARD: RRR; S1 and S2 appreciated; no murmurs, no clicks, no rubs, no gallops RESP: Normal chest excursion without splinting or tachypnea; breath sounds clear and equal bilaterally; no wheezes, no rhonchi, no rales, no hypoxia or respiratory distress, speaking full sentences ABD/GI: Normal bowel sounds; non-distended; soft, non-tender, no rebound, no guarding, no peritoneal signs BACK: The back appears normal EXT: Normal ROM in all joints; no deformity noted, no edema; no cyanosis, no calf tenderness or calf swelling, right knee is in a brace SKIN: Normal color for age and race; warm; no rash on exposed skin NEURO: Moves all extremities equally, normal speech PSYCH: The patient's mood and manner are appropriate.   ED Results / Procedures / Treatments   LABS: (all labs ordered are listed, but only abnormal results are displayed) Labs Reviewed  COMPREHENSIVE METABOLIC PANEL - Abnormal; Notable for the following components:      Result Value   Glucose, Bld 105 (*)    BUN 7 (*)    All other components within normal limits  GROUP A STREP BY PCR  RESP PANEL BY RT-PCR (FLU A&B, COVID) ARPGX2  CBC WITH DIFFERENTIAL/PLATELET  MAGNESIUM  TSH  D-DIMER, QUANTITATIVE  TROPONIN I (HIGH SENSITIVITY)     EKG:  EKG Interpretation  Date/Time:  Wednesday November 29 2021 17:30:17 EST Ventricular Rate:  71 PR Interval:  220 QRS Duration: 84 QT Interval:  340 QTC Calculation: 369 R Axis:   38 Text Interpretation: Sinus rhythm with 1st degree A-V block Otherwise normal ECG When compared with ECG of 23-Dec-2018 13:43, QT has shortened Confirmed by Rochele RaringWard, Luceil Herrin [phone] on 11/29/2021 6:28:21 PM         RADIOLOGY: My personal review and interpretation of imaging: Chest x-ray clear.  I have personally reviewed all radiology reports.   DG Chest 2 View  Result Date: 11/29/2021 CLINICAL DATA:  Palpitations EXAM: CHEST - 2 VIEW COMPARISON:  12/23/2018  FINDINGS: Cardiac and mediastinal contours are within normal limits. No focal pulmonary opacity. No pleural effusion or pneumothorax. No acute osseous abnormality. IMPRESSION: No acute cardiopulmonary process. Electronically Signed   By: Wiliam KeAlison  Vasan M.D.   On: 11/29/2021 18:49     PROCEDURES:  Critical Care performed: No   CRITICAL CARE Performed by: Baxter HireKristen Sherard Sutch   Total critical care time: 0 minutes  Critical care time was exclusive of separately billable procedures and treating other patients.  Critical care was necessary to treat or prevent imminent or life-threatening deterioration.  Critical care was time spent personally by me on the following activities: development of treatment plan with patient and/or surrogate as well as nursing, discussions with consultants, evaluation of patient's response to treatment, examination  of patient, obtaining history from patient or surrogate, ordering and performing treatments and interventions, ordering and review of laboratory studies, ordering and review of radiographic studies, pulse oximetry and re-evaluation of patient's condition.   Marland Kitchen1-3 Lead EKG Interpretation Performed by: Cheralyn Oliver, Layla Maw, DO Authorized by: Addisen Chappelle, Layla Maw, DO     Interpretation: normal     ECG rate:  76   ECG rate assessment: normal     Rhythm: sinus rhythm     Ectopy: none     Conduction: normal      IMPRESSION / MDM / ASSESSMENT AND PLAN / ED COURSE  I reviewed the triage vital signs and the nursing notes.    Patient here with complaints of palpitations and throat pain.  The patient is on the cardiac monitor to evaluate for evidence of arrhythmia and/or significant heart rate changes.   DIFFERENTIAL DIAGNOSIS (includes but not limited to):   ACS, PE, anemia, electrolyte derangement, thyroid dysfunction, dehydration, less likely dissection.  Throat pain could also be from strep pharyngitis, COVID or influenza.  No signs of uvulitis, deep space neck  infection, PTA.   PLAN: We will obtain CBC, BMP, magnesium level, TSH, troponin x2, D-dimer, chest x-ray, COVID and flu swab, strep swab.  Patient placed on cardiac monitoring.  Will give ibuprofen.   MEDICATIONS GIVEN IN ED: Medications  ibuprofen (ADVIL) tablet 800 mg (800 mg Oral Not Given 11/29/21 1901)     ED COURSE: Patient's labs show normal hemoglobin, normal electrolytes, renal function, LFTs, thyroid function, troponin and D-dimer.  Strep test is negative.  COVID and flu negative.  Chest x-ray reviewed by myself and radiologist shows no infiltrate, edema or pneumothorax.  She does have occasional PVCs on cardiac monitoring which I suspect is the cause of her symptoms today.  I feel she is safe to be discharged home and follow-up with her primary care doctor.  No sign of any bacterial infection today where she would need antibiotics.  Her throat pain seems more like pharyngitis than it does her anginal equivalent as she states it is a discomfort on the inside of her throat.  I do not feel she needs a second troponin given symptoms ongoing for several days.  Patient and son comfortable with this plan.  At this time, I do not feel there is any life-threatening condition present. I reviewed all nursing notes, vitals, pertinent previous records.  All lab and urine results, EKGs, imaging ordered have been independently reviewed and interpreted by myself.  I reviewed all available radiology reports from any imaging ordered this visit.  Based on my assessment, I feel the patient is safe to be discharged home without further emergent workup and can continue workup as an outpatient as needed. Discussed all findings, treatment plan as well as usual and customary return precautions with patient and son.  They verbalize understanding and are comfortable with this plan.  Outpatient follow-up has been provided as needed.  All questions have been answered.    CONSULTS: Patient does have risk factors for  ACS but has no chest pain, shortness of breath and has a reassuring work-up today.  No admission needed at this time.   OUTSIDE RECORDS REVIEWED: Reviewed patient's last internal medicine note with Dr. Burnadette Pop on 11/09/2021.         FINAL CLINICAL IMPRESSION(S) / ED DIAGNOSES   Final diagnoses:  Palpitations  Pharyngitis, unspecified etiology  PVC (premature ventricular contraction)     Rx / DC Orders   ED  Discharge Orders     None        Note:  This document was prepared using Dragon voice recognition software and may include unintentional dictation errors.   Alquan Morrish, Layla Maw, DO 11/29/21 [phone]

## 2021-11-29 NOTE — ED Notes (Signed)
Pt to ED for palpitations that started yesterday. Pt denies CP and SOB. Pt had episode of "tingling in fingertips" of L arm . Pt able to move hand arm freely.  ? ?Pt is A&Ox4. NAD ?

## 2021-11-29 NOTE — ED Triage Notes (Signed)
Pt presents to ED with c/o of palpitations that have been on and off but states more consistent today.  ? ?Pt also reports sore throat.  ?

## 2021-11-30 ENCOUNTER — Inpatient Hospital Stay: Admission: RE | Admit: 2021-11-30 | Payer: Medicare Other | Source: Ambulatory Visit

## 2021-12-12 ENCOUNTER — Other Ambulatory Visit: Payer: Medicare Other

## 2021-12-14 ENCOUNTER — Ambulatory Visit: Admit: 2021-12-14 | Payer: Medicare Other | Admitting: Orthopedic Surgery

## 2021-12-14 SURGERY — ARTHROPLASTY, KNEE, TOTAL
Anesthesia: Choice | Site: Knee | Laterality: Right

## 2022-02-23 ENCOUNTER — Other Ambulatory Visit: Payer: Self-pay | Admitting: Family Medicine

## 2022-02-23 ENCOUNTER — Ambulatory Visit
Admission: RE | Admit: 2022-02-23 | Discharge: 2022-02-23 | Disposition: A | Discharge status: Home / Self Care | Payer: Medicare Other | Source: Ambulatory Visit | Attending: Family Medicine | Admitting: Family Medicine

## 2022-02-23 DIAGNOSIS — R1900 Intra-abdominal and pelvic swelling, mass and lump, unspecified site: Secondary | ICD-10-CM | POA: Insufficient documentation

## 2022-02-23 DIAGNOSIS — R10812 Left upper quadrant abdominal tenderness: Secondary | ICD-10-CM | POA: Diagnosis not present

## 2022-03-01 ENCOUNTER — Other Ambulatory Visit: Payer: Self-pay | Admitting: Family Medicine

## 2022-03-01 DIAGNOSIS — Z1231 Encounter for screening mammogram for malignant neoplasm of breast: Secondary | ICD-10-CM

## 2022-03-29 ENCOUNTER — Ambulatory Visit
Admission: RE | Admit: 2022-03-29 | Discharge: 2022-03-29 | Disposition: A | Discharge status: Home / Self Care | Payer: Medicare Other | Source: Ambulatory Visit | Attending: Family Medicine | Admitting: Family Medicine

## 2022-03-29 DIAGNOSIS — Z1231 Encounter for screening mammogram for malignant neoplasm of breast: Secondary | ICD-10-CM | POA: Insufficient documentation

## 2022-10-17 ENCOUNTER — Emergency Department: Payer: 59

## 2022-10-17 ENCOUNTER — Emergency Department
Admission: EM | Admit: 2022-10-17 | Discharge: 2022-10-18 | Disposition: A | Discharge status: Home / Self Care | Payer: 59 | Attending: Emergency Medicine | Admitting: Emergency Medicine

## 2022-10-17 ENCOUNTER — Other Ambulatory Visit: Payer: Self-pay

## 2022-10-17 DIAGNOSIS — E119 Type 2 diabetes mellitus without complications: Secondary | ICD-10-CM | POA: Insufficient documentation

## 2022-10-17 DIAGNOSIS — I6782 Cerebral ischemia: Secondary | ICD-10-CM | POA: Diagnosis not present

## 2022-10-17 DIAGNOSIS — Z79899 Other long term (current) drug therapy: Secondary | ICD-10-CM | POA: Diagnosis not present

## 2022-10-17 DIAGNOSIS — H539 Unspecified visual disturbance: Secondary | ICD-10-CM | POA: Diagnosis not present

## 2022-10-17 DIAGNOSIS — R200 Anesthesia of skin: Secondary | ICD-10-CM | POA: Insufficient documentation

## 2022-10-17 DIAGNOSIS — R531 Weakness: Secondary | ICD-10-CM | POA: Diagnosis not present

## 2022-10-17 DIAGNOSIS — I10 Essential (primary) hypertension: Secondary | ICD-10-CM | POA: Insufficient documentation

## 2022-10-17 LAB — ETHANOL: Alcohol, Ethyl (B): <10 mg/dL (ref <10)

## 2022-10-17 LAB — COMPREHENSIVE METABOLIC PANEL
ALT: 17 U/L (ref 0–44)
AST: 24 U/L (ref 15–41)
Albumin: 3.8 g/dL (ref 3.5–5.0)
Alkaline Phosphatase: 38 U/L (ref 38–126)
Anion gap: 7 (ref 5–15)
BUN: 7 mg/dL — ABNORMAL LOW (ref 8–23)
CO2: 26 mmol/L (ref 22–32)
Calcium: 9.2 mg/dL (ref 8.9–10.3)
Chloride: 106 mmol/L (ref 98–111)
Creatinine, Ser: 0.7 mg/dL (ref 0.44–1.00)
GFR, Estimated: >60 mL/min (ref >60)
Glucose, Bld: 99 mg/dL (ref 70–99)
Potassium: 3.6 mmol/L (ref 3.5–5.1)
Sodium: 139 mmol/L (ref 135–145)
Total Bilirubin: 0.6 mg/dL (ref 0.3–1.2)
Total Protein: 8 g/dL (ref 6.5–8.1)

## 2022-10-17 LAB — PROTIME-INR
INR: 1.1 (ref 0.8–1.2)
Prothrombin Time: 13.9 seconds (ref 11.4–15.2)

## 2022-10-17 LAB — CBC
HCT: 39.4 % (ref 36.0–46.0)
Hemoglobin: 12.5 g/dL (ref 12.0–15.0)
MCH: 30.7 pg (ref 26.0–34.0)
MCHC: 31.7 g/dL (ref 30.0–36.0)
MCV: 96.8 fL (ref 80.0–100.0)
Platelets: 419 10*3/uL — ABNORMAL HIGH (ref 150–400)
RBC: 4.07 MIL/uL (ref 3.87–5.11)
RDW: 15.1 % (ref 11.5–15.5)
WBC: 5.7 10*3/uL (ref 4.0–10.5)
nRBC: 0 % (ref 0.0–0.2)

## 2022-10-17 LAB — DIFFERENTIAL
Abs Immature Granulocytes: 0.01 10*3/uL (ref 0.00–0.07)
Basophils Absolute: 0.1 10*3/uL (ref 0.0–0.1)
Basophils Relative: 2 %
Eosinophils Absolute: 0.2 10*3/uL (ref 0.0–0.5)
Eosinophils Relative: 4 %
Immature Granulocytes: 0 %
Lymphocytes Relative: 49 %
Lymphs Abs: 2.8 10*3/uL (ref 0.7–4.0)
Monocytes Absolute: 0.4 10*3/uL (ref 0.1–1.0)
Monocytes Relative: 6 %
Neutro Abs: 2.3 10*3/uL (ref 1.7–7.7)
Neutrophils Relative %: 39 %

## 2022-10-17 LAB — APTT: aPTT: 33 seconds (ref 24–36)

## 2022-10-17 MED ORDER — SODIUM CHLORIDE 0.9% FLUSH
3.0000 mL | Freq: Once | INTRAVENOUS | Status: AC
  Administered 2022-10-17: 3 mL via INTRAVENOUS

## 2022-10-17 MED ORDER — IOHEXOL 350 MG/ML SOLN
75.0000 mL | Freq: Once | INTRAVENOUS | Status: AC | PRN
  Administered 2022-10-17: 75 mL via INTRAVENOUS

## 2022-10-17 NOTE — ED Provider Notes (Signed)
[facility] Provider Note    Event Date/Time   First MD Initiated Contact with Patient 10/17/22 2131     (approximate)   History   Numbness   HPI  Kelly Rose is a 73 y.o. female  here with left arm numbness, weakness, and perioral tingling. Pt states that around 3 PM today she had acute onset of left arm numbness and feeling like it was asleep. She had weakness in the arm as well. Since then, she has had some intermittent tingling in her jaw though this has been ongoing since starting a medication with her PCP. No h/o CVA or TIAs. No other neuro deficits. She had some mild blurred vision as well that has resolved.       Physical Exam   Triage Vital Signs: ED Triage Vitals  Enc Vitals Group     BP 10/17/22 1808 (!) 145/86     Pulse Rate 10/17/22 1808 72     Resp 10/17/22 1808 18     Temp 10/17/22 1808 98.1 F (36.7 C)     Temp src --      SpO2 10/17/22 1808 100 %     Weight --      Height --      Head Circumference --      Peak Flow --      Pain Score 10/17/22 1759 0     Pain Loc --      Pain Edu? --      Excl. in [place]? --     Most recent vital signs: Vitals:   10/17/22 1808 10/17/22 2240  BP: (!) 145/86 110/64  Pulse: 72 66  Resp: 18 13  Temp: 98.1 F (36.7 C) 98.5 F (36.9 C)  SpO2: 100% 96%     General: Awake, no distress.  CV:  Good peripheral perfusion. RRR. Resp:  Normal effort. Lungs CTAB. Abd:  No distention. No tenderness. Other:  CNII-XII intact. Strength 5/5 proximal and distal UE and LE bilaterally. Normal sensation to light touch. No neglect. Normal FTN and HTS. Normal gait.   ED Results / Procedures / Treatments   Labs (all labs ordered are listed, but only abnormal results are displayed) Labs Reviewed  CBC - Abnormal; Notable for the following components:      Result Value   Platelets 419 (*)    All other components within normal limits  COMPREHENSIVE METABOLIC PANEL - Abnormal; Notable for the following  components:   BUN 7 (*)    All other components within normal limits  PROTIME-INR  APTT  DIFFERENTIAL  ETHANOL  LIPID PANEL  I-STAT CREATININE, ED  CBG MONITORING, ED     EKG Normal sinus rhythm, VR 81. PR 202, QRS 80, QTc 436. No acute St elevations or depressions. No ischemia or infarct.   RADIOLOGY CT Head: [place]    I also independently reviewed and agree with radiologist interpretations.   PROCEDURES:  Critical Care performed: No   MEDICATIONS ORDERED IN ED: Medications  sodium chloride flush (NS) 0.9 % injection 3 mL (has no administration in time range)  iohexol (OMNIPAQUE) 350 MG/ML injection 75 mL (75 mLs Intravenous Contrast Given 10/17/22 2249)     IMPRESSION / MDM / ASSESSMENT AND PLAN / ED COURSE  I reviewed the triage vital signs and the nursing notes.  Differential diagnosis includes, but is not limited to, TIA, CVA, neuropathy, intracranial mass/lesion, electrolyte abnormality, atypical mgiraine  Patient's presentation is most consistent with acute presentation with potential threat to life or bodily function.  The patient is on the cardiac monitor to evaluate for evidence of arrhythmia and/or significant heart rate changes.  72 yo F with PMHx HTN, HLD, GERD, Dm here with transient L arm numbness and weakness. Clinically, suspect possible neuropathy/radiculopathy, TIA, or possibly atypical migraine. CT head shows NAICA. Labs overall very reassuring with no leukocytosis or anemia. CMP is unremarkable. etOH negative. INR normal.   Given age, comorbidities, will obtain MRI and CT Angio. If negative, can likely f/u with Neuro as outpatient.    FINAL CLINICAL IMPRESSION(S) / ED DIAGNOSES   Final diagnoses:  Left arm numbness     Rx / DC Orders   ED Discharge Orders     None        Note:  This document was prepared using Dragon voice recognition software and may include unintentional dictation errors.    Duffy Bruce, MD 10/17/22 [phone]

## 2022-10-17 NOTE — ED Triage Notes (Addendum)
Pt comes with c/o numbness and tingling that started today. Pt states it comes and goes. Pt states left arm and jaw.  Pt states some weakness and dizziness. Pt states sometimes vision is blurry.   Pt states she started new med month ago for osteoarthritis and was told by doctor if she started to have any numbness to stop the medication. Pt takes this every Friday once a week.

## 2022-10-18 LAB — LIPID PANEL
Cholesterol: 144 mg/dL (ref 0–200)
HDL: 56 mg/dL (ref >40)
LDL Cholesterol: 77 mg/dL (ref 0–99)
Total CHOL/HDL Ratio: 2.6 RATIO
Triglycerides: 54 mg/dL (ref <150)
VLDL: 11 mg/dL (ref 0–40)

## 2022-10-18 NOTE — ED Provider Notes (Signed)
-----------------------------------------   1:05 AM on 10/18/2022 -----------------------------------------  I took over care of this patient from Dr. Ellender Hose.  CT angio and MRI of the brain are both negative for acute findings.  The patient remains asymptomatic.  At this time there is no evidence of acute stroke.  The patient is stable for discharge.  I counseled her on the results of the workup and on the importance of close follow-up.  She is already taking aspirin daily.  I gave her strict return precautions and she expressed understanding.  CT angio head/neck:  IMPRESSION:  1. Negative CTA for large vessel occlusion or other emergent  finding.  2. 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  3. Otherwise wide patency of the major arterial vasculature of the  head and neck. No other hemodynamically significant or correctable  stenosis.   MR brain:  IMPRESSION:  1. No acute intracranial abnormality.  2. Mild to moderate chronic microvascular ischemic disease.     Arta Silence, MD 10/18/22 [phone]

## 2022-10-18 NOTE — Discharge Instructions (Signed)
Continue taking your aspirin daily.  Follow-up with the primary care doctor as soon as possible.  Return to the ER for new, worsening, or persistent severe numbness or weakness, vision changes, difficulty speaking or walking, or any other new or worsening symptoms that concern you.

## 2023-01-08 IMAGING — CT CT KNEE*R* W/O CM
1 of 3 series · 6 of 14 positions shown, 8 images · non-contrast
Comparison: None.

CLINICAL DATA: Right knee pain.  Preop knee replacement.

EXAM:
CT OF THE RIGHT KNEE WITHOUT CONTRAST
TECHNIQUE: Multidetector CT imaging of the RIGHT knee was performed according
to the standard protocol. Multiplanar CT image reconstructions were
also generated.
RADIATION DOSE REDUCTION: This exam was performed according to the
departmental dose-optimization program which includes automated
exposure control, adjustment of the mA and/or kV according to
patient size and/or use of iterative reconstruction technique.

[Series 10: axial st · axial · 0.39mm/px · z∈[+533,+759]mm · 6 of 318 slices shown, 8 images]
[im 46/318  soft-tissue]
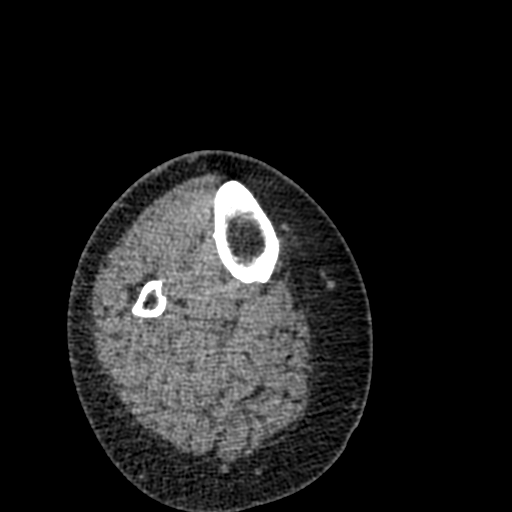
[im 46/318  bone]
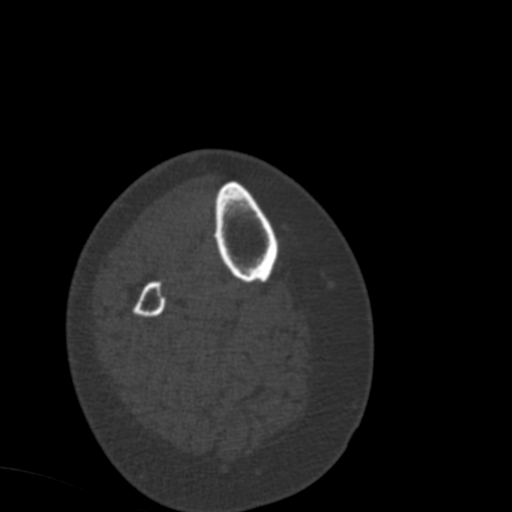
[im 91/318  bone]
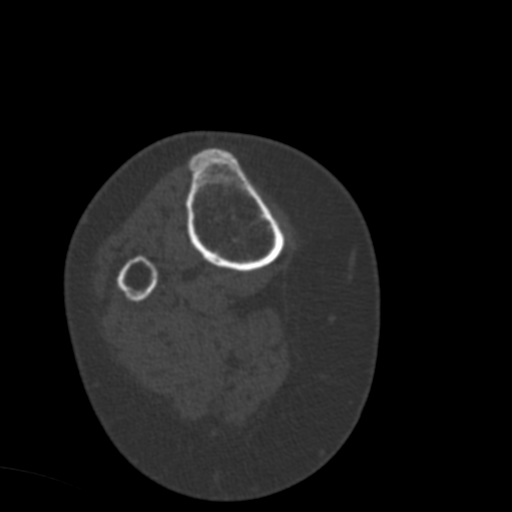
[im 136/318  bone]
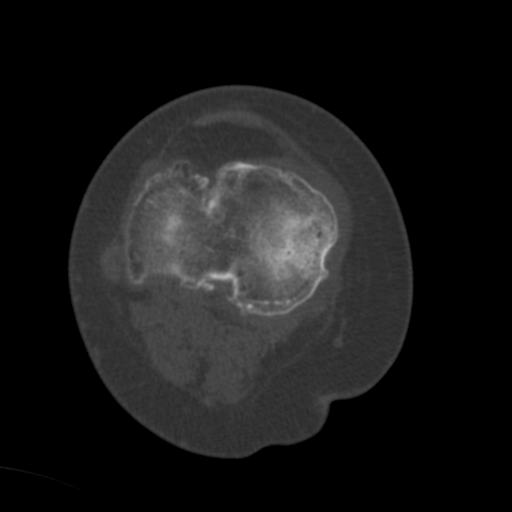
[im 182/318  bone]
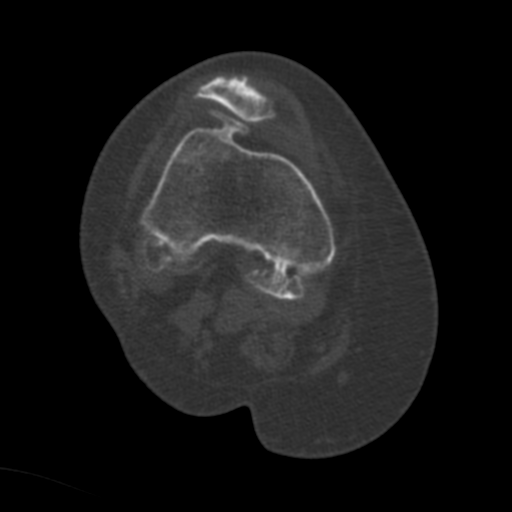
[im 227/318  soft-tissue]
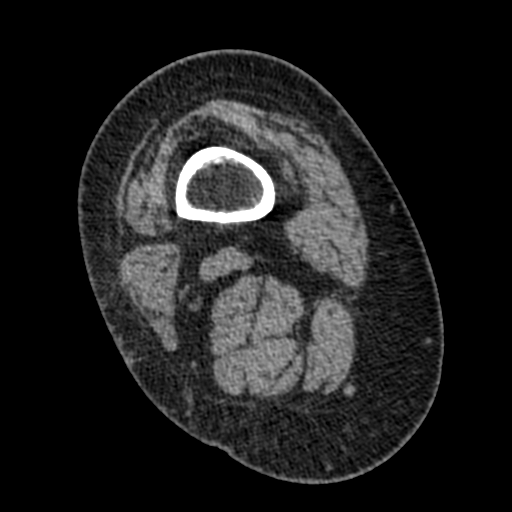
[im 227/318  bone]
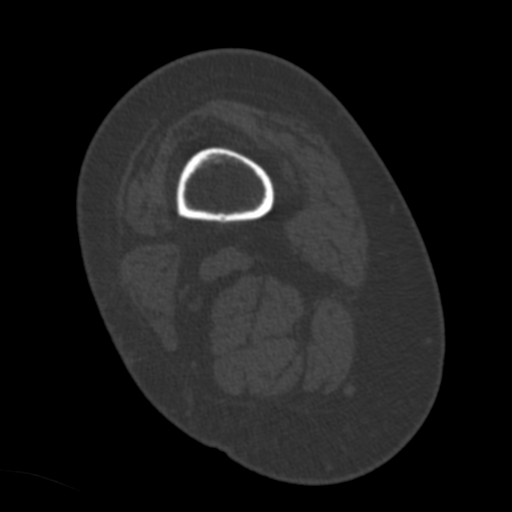
[im 272/318  bone]
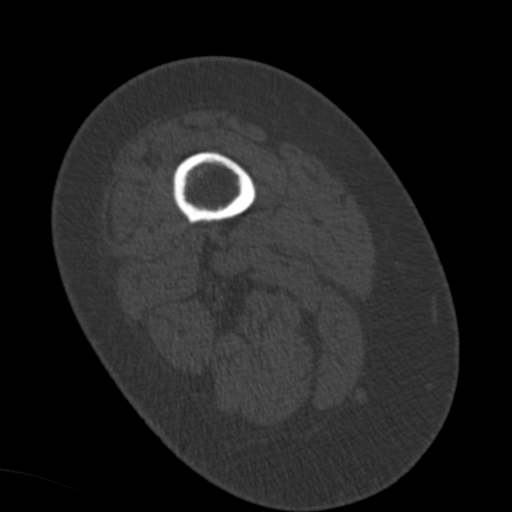

[6 of 14 positions shown; findings below may reference images not displayed]

FINDINGS: Bones/Joint/Cartilage

Hip demonstrates no fracture or dislocation. No lytic or blastic
lesion. Mild osteoarthritis of the right SI joint. Mild right hip
joint space narrowing.

Knee demonstrates no fracture or dislocation. No lytic or blastic
lesion. Moderate-severe osteoarthritis of the medial femorotibial
compartment. Mild lateral femorotibial compartment osteoarthritis.
Moderate-severe osteoarthritis of the lateral patellofemoral
compartment. Tricompartmental marginal osteophytes. Small knee joint
effusion.

Ankle demonstrates no fracture or dislocation. No lytic or blastic
lesion.

Ligaments

Ligaments are suboptimally evaluated by CT.

Muscles and Tendons
Muscles are normal. No muscle atrophy. Quadriceps tendon and
patellar tendon are intact. Flexor, extensor, peroneal and Achilles
tendons are grossly intact.

Soft tissue
No fluid collection or hematoma.  No soft tissue mass.
IMPRESSION: 1. Tricompartmental osteoarthritis of the right knee, most severe in
the medial femorotibial compartment.

## 2023-04-01 ENCOUNTER — Other Ambulatory Visit: Payer: Self-pay | Admitting: Family Medicine

## 2023-04-01 DIAGNOSIS — Z1231 Encounter for screening mammogram for malignant neoplasm of breast: Secondary | ICD-10-CM

## 2023-04-04 ENCOUNTER — Ambulatory Visit
Admission: RE | Admit: 2023-04-04 | Discharge: 2023-04-04 | Disposition: A | Discharge status: Home / Self Care | Payer: 59 | Source: Ambulatory Visit | Attending: Family Medicine | Admitting: Family Medicine

## 2023-04-04 DIAGNOSIS — Z1231 Encounter for screening mammogram for malignant neoplasm of breast: Secondary | ICD-10-CM | POA: Diagnosis present

## 2023-04-12 ENCOUNTER — Emergency Department
Admission: EM | Admit: 2023-04-12 | Discharge: 2023-04-12 | Disposition: A | Discharge status: Home / Self Care | Payer: 59 | Attending: Emergency Medicine | Admitting: Emergency Medicine

## 2023-04-12 ENCOUNTER — Other Ambulatory Visit: Payer: Self-pay

## 2023-04-12 DIAGNOSIS — I10 Essential (primary) hypertension: Secondary | ICD-10-CM | POA: Insufficient documentation

## 2023-04-12 DIAGNOSIS — R42 Dizziness and giddiness: Secondary | ICD-10-CM | POA: Diagnosis not present

## 2023-04-12 DIAGNOSIS — E119 Type 2 diabetes mellitus without complications: Secondary | ICD-10-CM | POA: Insufficient documentation

## 2023-04-12 DIAGNOSIS — E860 Dehydration: Secondary | ICD-10-CM

## 2023-04-12 DIAGNOSIS — R55 Syncope and collapse: Secondary | ICD-10-CM | POA: Insufficient documentation

## 2023-04-12 LAB — CBC
HCT: 36 % (ref 36.0–46.0)
Hemoglobin: 11.7 g/dL — ABNORMAL LOW (ref 12.0–15.0)
MCH: 31.4 pg (ref 26.0–34.0)
MCHC: 32.5 g/dL (ref 30.0–36.0)
MCV: 96.5 fL (ref 80.0–100.0)
Platelets: 326 10*3/uL (ref 150–400)
RBC: 3.73 MIL/uL — ABNORMAL LOW (ref 3.87–5.11)
RDW: 14.9 % (ref 11.5–15.5)
WBC: 8.4 10*3/uL (ref 4.0–10.5)
nRBC: 0.4 % — ABNORMAL HIGH (ref 0.0–0.2)

## 2023-04-12 LAB — BASIC METABOLIC PANEL
Anion gap: 8 (ref 5–15)
BUN: 10 mg/dL (ref 8–23)
CO2: 22 mmol/L (ref 22–32)
Calcium: 7.8 mg/dL — ABNORMAL LOW (ref 8.9–10.3)
Chloride: 108 mmol/L (ref 98–111)
Creatinine, Ser: 0.77 mg/dL (ref 0.44–1.00)
GFR, Estimated: >60 mL/min (ref >60)
Glucose, Bld: 143 mg/dL — ABNORMAL HIGH (ref 70–99)
Potassium: 3.4 mmol/L — ABNORMAL LOW (ref 3.5–5.1)
Sodium: 138 mmol/L (ref 135–145)

## 2023-04-12 LAB — TROPONIN I (HIGH SENSITIVITY)
Troponin I (High Sensitivity): 4 ng/L (ref <18)
Troponin I (High Sensitivity): 7 ng/L (ref <18)

## 2023-04-12 MED ORDER — SODIUM CHLORIDE 0.9 % IV BOLUS
1000.0000 mL | Freq: Once | INTRAVENOUS | Status: AC
  Administered 2023-04-12: 1000 mL via INTRAVENOUS

## 2023-04-12 NOTE — ED Triage Notes (Signed)
Pt to ed from home via [facility] for near syncope. Pt hasn't been drinking a lot of fluid today. Pt advised she felt very weak today. Pt is caox4, in no acute distress and in a wheel chair in triage.

## 2023-04-12 NOTE — ED Notes (Signed)
Pt to ed via [facility], pt has been outside moving furniture for the past hour, pt became dizzy and had syncopal episode. Pts son caught pt before she fell. CBG 195. 129/67 80bpm 100% o2. Pt has 20g RFA. Pt given 500NS

## 2023-04-12 NOTE — ED Provider Notes (Signed)
[facility] Provider Note    Event Date/Time   First MD Initiated Contact with Patient 04/12/23 1738     (approximate)  History   Chief Complaint: Near Syncope  HPI  Kelly Rose is a 72 y.o. female with a past medical history depression, diabetes, hypertension, presents to the emergency department for a near syncopal episode.  According to the patient she was helping move some furniture while she was outside.  Patient states she became very sweaty states it was very hot out and she became very lightheaded and.  Nearly passed out a family member was able to help her down to the ground.  Family member states she nearly passed out or possibly briefly passed out.  Patient states she last passed out approximately 3 months ago or so while at home, but states at that time she did not completely pass out was able to lower herself to the ground.  Patient states she is followed up with her doctor who has worked her up but has not found any cause for this.  Patient states she feels today was due to being out in the heat and overexerting yourself.  Denies any chest pain at any point.  Physical Exam   Triage Vital Signs: ED Triage Vitals [04/12/23 1611]  Encounter Vitals Group     BP 137/69     Systolic BP Percentile      Diastolic BP Percentile      Pulse Rate 70     Resp 16     Temp 98 F (36.7 C)     Temp Source Oral     SpO2 98 %     Weight 194 lb 0.1 oz (88 kg)     Height 5\' 7"  (1.702 m)     Head Circumference      Peak Flow      Pain Score 0     Pain Loc      Pain Education      Exclude from Growth Chart     Most recent vital signs: Vitals:   04/12/23 1611  BP: 137/69  Pulse: 70  Resp: 16  Temp: 98 F (36.7 C)  SpO2: 98%    General: Awake, no distress.  CV:  Good peripheral perfusion.  Regular rate and rhythm  Resp:  Normal effort.  Equal breath sounds bilaterally.  Abd:  No distention.  Soft, nontender.  No rebound or  guarding.  ED Results / Procedures / Treatments   EKG  EKG viewed and interpreted by myself shows a sinus rhythm at 72 bpm with a narrow QRS, normal axis, normal intervals, no concerning ST changes.  MEDICATIONS ORDERED IN ED: Medications  sodium chloride 0.9 % bolus 1,000 mL (has no administration in time range)     IMPRESSION / MDM / ASSESSMENT AND PLAN / ED COURSE  I reviewed the triage vital signs and the nursing notes.  Patient's presentation is most consistent with acute presentation with potential threat to life or bodily function.  Patient presents emergency department after a syncopal versus near syncopal episode.  Overall the patient appears well, no distress.  Patient states he was outside working in the heat she is not sure if she got too hot.  Denies any chest pain or shortness of breath.  Patient has had syncopal episodes in the past.  Overall patient appears well, reassuring physical exam, reassuring vital signs.  Patient's initial lab work shows no significant findings reassuring CBC, reassuring  chemistry and a negative troponin.  We will repeat a troponin we will IV hydrate and continue to closely monitor.  As long as the patient's repeat troponin remains negative and she continues to feel well anticipate likely discharge home with PCP follow-up.  Patient and family agreeable to plan of care and workup.  Patient's lab work is reassuring, troponin negative x 2.  Patient states she is feeling better and is ready to go home.  Discussed with the patient increase amount of fluid she drinks over the weekend, avoid heat or exertion and to follow-up with her doctor on Monday.  Patient agreeable to plan of care.  Provided my typical syncope return precautions.  FINAL CLINICAL IMPRESSION(S) / ED DIAGNOSES   Syncope   Note:  This document was prepared using Dragon voice recognition software and may include unintentional dictation errors.   Minna Antis, MD 04/12/23 1924

## 2023-04-12 NOTE — Discharge Instructions (Addendum)
As we discussed obtain plenty of rest and drink plenty of fluids over the weekend.  Avoid heat and overexertion.  Aloe up with your doctor on Monday or Tuesday for recheck/reevaluation.  Return to the emergency department for any further episodes of dizziness lightheadedness or any other symptom personally concerning to yourself.

## 2023-04-12 NOTE — ED Notes (Addendum)
Pt's family at bedside, translating for pt. Pt. Agreed, would like to keep family translating.

## 2023-04-20 ENCOUNTER — Emergency Department (HOSPITAL_COMMUNITY)
Admission: EM | Admit: 2023-04-20 | Discharge: 2023-04-20 | Disposition: A | Discharge status: Home / Self Care | Payer: 59 | Attending: Emergency Medicine | Admitting: Emergency Medicine

## 2023-04-20 ENCOUNTER — Other Ambulatory Visit: Payer: Self-pay

## 2023-04-20 ENCOUNTER — Emergency Department (HOSPITAL_COMMUNITY): Payer: 59

## 2023-04-20 DIAGNOSIS — Z7982 Long term (current) use of aspirin: Secondary | ICD-10-CM | POA: Insufficient documentation

## 2023-04-20 DIAGNOSIS — I10 Essential (primary) hypertension: Secondary | ICD-10-CM | POA: Diagnosis not present

## 2023-04-20 DIAGNOSIS — R002 Palpitations: Secondary | ICD-10-CM | POA: Diagnosis not present

## 2023-04-20 DIAGNOSIS — E119 Type 2 diabetes mellitus without complications: Secondary | ICD-10-CM | POA: Diagnosis not present

## 2023-04-20 DIAGNOSIS — Z79899 Other long term (current) drug therapy: Secondary | ICD-10-CM | POA: Diagnosis not present

## 2023-04-20 LAB — COMPREHENSIVE METABOLIC PANEL
ALT: 10 U/L (ref 0–44)
AST: 21 U/L (ref 15–41)
Albumin: 3.4 g/dL — ABNORMAL LOW (ref 3.5–5.0)
Alkaline Phosphatase: 32 U/L — ABNORMAL LOW (ref 38–126)
Anion gap: 8 (ref 5–15)
BUN: 9 mg/dL (ref 8–23)
CO2: 25 mmol/L (ref 22–32)
Calcium: 8.7 mg/dL — ABNORMAL LOW (ref 8.9–10.3)
Chloride: 107 mmol/L (ref 98–111)
Creatinine, Ser: 0.59 mg/dL (ref 0.44–1.00)
GFR, Estimated: >60 mL/min (ref >60)
Glucose, Bld: 96 mg/dL (ref 70–99)
Potassium: 3.7 mmol/L (ref 3.5–5.1)
Sodium: 140 mmol/L (ref 135–145)
Total Bilirubin: 0.9 mg/dL (ref 0.3–1.2)
Total Protein: 6.9 g/dL (ref 6.5–8.1)

## 2023-04-20 LAB — TROPONIN I (HIGH SENSITIVITY)
Troponin I (High Sensitivity): 7 ng/L (ref <18)
Troponin I (High Sensitivity): 8 ng/L (ref <18)

## 2023-04-20 LAB — CBC WITH DIFFERENTIAL/PLATELET
Abs Immature Granulocytes: 0.02 10*3/uL (ref 0.00–0.07)
Basophils Absolute: 0.1 10*3/uL (ref 0.0–0.1)
Basophils Relative: 1 %
Eosinophils Absolute: 0.1 10*3/uL (ref 0.0–0.5)
Eosinophils Relative: 2 %
HCT: 36.1 % (ref 36.0–46.0)
Hemoglobin: 11.7 g/dL — ABNORMAL LOW (ref 12.0–15.0)
Immature Granulocytes: 0 %
Lymphocytes Relative: 25 %
Lymphs Abs: 1.3 10*3/uL (ref 0.7–4.0)
MCH: 31.5 pg (ref 26.0–34.0)
MCHC: 32.4 g/dL (ref 30.0–36.0)
MCV: 97.3 fL (ref 80.0–100.0)
Monocytes Absolute: 0.5 10*3/uL (ref 0.1–1.0)
Monocytes Relative: 10 %
Neutro Abs: 3 10*3/uL (ref 1.7–7.7)
Neutrophils Relative %: 62 %
Platelets: 368 10*3/uL (ref 150–400)
RBC: 3.71 MIL/uL — ABNORMAL LOW (ref 3.87–5.11)
RDW: 14.9 % (ref 11.5–15.5)
WBC: 5 10*3/uL (ref 4.0–10.5)
nRBC: 0 % (ref 0.0–0.2)

## 2023-04-20 MED ORDER — ACETAMINOPHEN 500 MG PO TABS
1000.0000 mg | ORAL_TABLET | Freq: Once | ORAL | Status: AC
  Administered 2023-04-20: 1000 mg via ORAL
  Filled 2023-04-20: qty 2

## 2023-04-20 NOTE — ED Triage Notes (Signed)
Coming from home, bring by ambulance. Pt had a sudden feeling of palpitation and shaking on left side of the chest at 2am today that stopped briefly. At 730 am pt had another episode of a few seconds. Denies chest pain.   PT Aox4.

## 2023-04-20 NOTE — ED Notes (Signed)
Took patient saline lock out patient is getting dressed °

## 2023-04-20 NOTE — Discharge Instructions (Addendum)
Your workup today was reassuring.  You should call your primary care doctor on Monday to schedule follow-up, you may need an outpatient heart monitor.  Your x-ray did show a possible lung nodule in the left lower lung.  You need to follow-up with your doctor for this as you may need a repeat x-ray in 1 to 2 months to make sure it is not changing.  If you develop worsening palpitations, dizziness, fainting, chest pain, difficulty breathing you should return to the ED.

## 2023-04-20 NOTE — ED Notes (Signed)
Patient transported to X-ray 

## 2023-04-20 NOTE — ED Provider Notes (Signed)
[place] EMERGENCY DEPARTMENT AT [facility] Provider Note   CSN: [id number] Arrival date & time: 04/20/23  0753     History  Chief Complaint  Patient presents with   Palpitations    [facility] Kelly Rose is a 72 y.o. female.   Palpitations 72 year old female history of diabetes, hypertension presenting for palpitations.  She states around 2 AM and 7 AM she had 2 episodes of brief palpitations which resolved on their own.  No chest pain, difficulty breathing, dizziness, lightheadedness, or syncope.  She has not had symptoms like this before.  No change in her medications.  She is to take metoprolol but does not take it for a long time.  States he is otherwise been well.  No fevers or chills.  No vomiting or diarrhea.  She has been eating and drinking normally.  Currently she is asymptomatic.     Home Medications Prior to Admission medications   Medication Sig Start Date End Date Taking? Authorizing Provider  aspirin 81 MG tablet Take 81 mg by mouth daily.    [provider]  benzonatate (TESSALON) 100 MG capsule Take 2 capsules (200 mg total) by mouth every 8 (eight) hours. Patient not taking: Reported on 11/24/2021 10/11/21   Becky Augusta, NP  ipratropium (ATROVENT) 0.06 % nasal spray Place 2 sprays into both nostrils 4 (four) times daily. Patient taking differently: Place 1 spray into both nostrils daily as needed for rhinitis (allergies). 10/11/21   Becky Augusta, NP  losartan (COZAAR) 50 MG tablet Take 1 tablet (50 mg total) by mouth daily. 05/22/16   Johnson, Megan P, DO  Melatonin 10 MG TABS Take 10 mg by mouth at bedtime as needed (sleep).    [provider]  metoprolol succinate (TOPROL-XL) 25 MG 24 hr tablet Take 25 mg by mouth daily as needed (High Blood Pressure). 10/12/21   [provider]  Multiple Vitamins-Minerals (MULTIVITAMIN WITH MINERALS) tablet Take 1 tablet by mouth daily. Centrum for Women    [provider]   pantoprazole (PROTONIX) 40 MG tablet Take 40 mg by mouth daily as needed (acid reflux). 11/05/21   [provider]  promethazine-dextromethorphan (PROMETHAZINE-DM) 6.25-15 MG/5ML syrup Take 5 mLs by mouth 4 (four) times daily as needed. Patient not taking: Reported on 11/24/2021 10/11/21   Becky Augusta, NP  Semaglutide (RYBELSUS) 7 MG TABS Take 14 mg by mouth daily.    [provider]  simvastatin (ZOCOR) 20 MG tablet Take 1 tablet (20 mg total) by mouth at bedtime. 05/22/16   Olevia Perches P, DO      Allergies    Lisinopril and Metformin and related    Review of Systems   Review of Systems  Cardiovascular:  Positive for palpitations.  Review of systems completed and notable as per HPI.  ROS otherwise negative.   Physical Exam Updated Vital Signs BP 132/72   Pulse 82   Temp 98.3 F (36.8 C) (Oral)   Resp 15   Ht 5\' 7"  (1.702 m)   Wt 86.2 kg   SpO2 97%   BMI 29.76 kg/m  Physical Exam Vitals and nursing note reviewed.  Constitutional:      General: She is not in acute distress.    Appearance: She is well-developed.  HENT:     Head: Normocephalic and atraumatic.  Eyes:     Extraocular Movements: Extraocular movements intact.     Conjunctiva/sclera: Conjunctivae normal.     Pupils: Pupils are equal, round, and reactive  to light.  Cardiovascular:     Rate and Rhythm: Normal rate and regular rhythm.     Heart sounds: No murmur heard. Pulmonary:     Effort: Pulmonary effort is normal. No respiratory distress.     Breath sounds: Normal breath sounds.  Abdominal:     Palpations: Abdomen is soft.     Tenderness: There is no abdominal tenderness. There is no guarding or rebound.  Musculoskeletal:        General: No swelling.     Cervical back: Neck supple.     Right lower leg: No edema.     Left lower leg: No edema.  Skin:    General: Skin is warm and dry.     Capillary Refill: Capillary refill takes less than 2 seconds.  Neurological:     General: No  focal deficit present.     Mental Status: She is alert and oriented to person, place, and time. Mental status is at baseline.  Psychiatric:        Mood and Affect: Mood normal.     ED Results / Procedures / Treatments   Labs (all labs ordered are listed, but only abnormal results are displayed) Labs Reviewed  CBC WITH DIFFERENTIAL/PLATELET - Abnormal; Notable for the following components:      Result Value   RBC 3.71 (*)    Hemoglobin 11.7 (*)    All other components within normal limits  COMPREHENSIVE METABOLIC PANEL - Abnormal; Notable for the following components:   Calcium 8.7 (*)    Albumin 3.4 (*)    Alkaline Phosphatase 32 (*)    All other components within normal limits  TROPONIN I (HIGH SENSITIVITY)  TROPONIN I (HIGH SENSITIVITY)    EKG EKG Interpretation Date/Time:  Saturday April 20 2023 08:20:34 EDT Ventricular Rate:  71 PR Interval:  257 QRS Duration:  95 QT Interval:  416 QTC Calculation: 453 R Axis:   55  Text Interpretation: Age not entered, assumed to be  72 years old for purpose of ECG interpretation Sinus rhythm Atrial premature complex Prolonged PR interval Abnormal R-wave progression, early transition Confirmed by Fulton Reek [phone]) on 04/20/2023 10:50:05 AM  Radiology DG Chest 2 View  Result Date: 04/20/2023 CLINICAL DATA:  72 year old female with history of palpitations. EXAM: CHEST - 2 VIEW COMPARISON:  Chest x-ray 11/29/2021. FINDINGS: Lung volumes are normal. No consolidative airspace disease. No pleural effusions. No pneumothorax. Nodular density projecting over the left mid to lower lung on the frontal view, not well localized on the lateral view, estimated to measure approximately 1.4 x 0.9 cm. Pulmonary vasculature and the cardiomediastinal silhouette are within normal limits. Atherosclerosis in the thoracic aorta. IMPRESSION: 1. No radiographic evidence of acute cardiopulmonary disease. 2. Nodular density projecting over the left mid to lower  lung. Standing PA and lateral chest radiograph is recommended in 1-2 months to ensure the stability or regression of this finding, as neoplasm is not excluded. 3. Aortic atherosclerosis. Electronically Signed   By: Trudie Reed M.D.   On: 04/20/2023 09:08    Procedures Procedures    Medications Ordered in ED Medications  acetaminophen (TYLENOL) tablet 1,000 mg (1,000 mg Oral Given 04/20/23 0940)    ED Course/ Medical Decision Making/ A&P Clinical Course as of 04/20/23 1257  Sat Apr 20, 2023  0823 0200 - 0700, stopped metop a long time ago [JD]  0920 EKG sinus rhythm with first-degree AV block, no evidence of arrhythmia or ischemia. [JD]    Clinical Course  User Index [JD] Laurence Spates, MD                             Medical Decision Making Amount and/or Complexity of Data Reviewed Labs: ordered. Radiology: ordered.   Medical Decision Making:   Kelly Rose is a 72 y.o. female who presented to the ED today with palpitations.  Vital signs reviewed.  On exam she is well-appearing, currently is asymptomatic.  She has no frank chest pain, will obtain EKG as well as troponin for screen evaluation of possible ACS.  She appears euvolemic.  She has no tachycardia, pleuritic pain, shortness of breath, or hypoxia, low suspicion for PE.  EKG without ischemia or arrhythmia.   Patient placed on continuous vitals and telemetry monitoring while in ED which was reviewed periodically.  Reviewed and confirmed nursing documentation for past medical history, family history, social history.  Initial Study Results:   Laboratory  All laboratory results reviewed.  Labs notable for stable anemia.  EKG EKG was reviewed independently. Rate, rhythm, axis, intervals all examined and without medically relevant abnormality. ST segments without concerns for elevations.    Radiology:  All images reviewed independently.  Possible lung nodule.  Agree with radiology report at this  time.  Reassessment and Plan:   On reassessment patient remained stable.  She has not had any additional symptoms and is currently asymptomatic.  Her work appears very reassuring.  I do not see signs of arrhythmia, ACS or other acute abnormality.  Unclear what caused her palpitations earlier.  She does have a small lung nodule, I discussed this with the patient including need for close follow-up for reimaging to evaluate for neoplasm.  Given reassuring workup I think she is stable for outpatient management.  Recommend she call her doctor on Monday to schedule follow-up with PCP and possible heart monitor.  Return precautions given.   Patient's presentation is most consistent with acute presentation with potential threat to life or bodily function.           Final Clinical Impression(s) / ED Diagnoses Final diagnoses:  Palpitations    Rx / DC Orders ED Discharge Orders     None         Laurence Spates, MD 04/20/23 1257

## 2023-05-02 ENCOUNTER — Other Ambulatory Visit: Payer: Self-pay | Admitting: Family Medicine

## 2023-05-02 DIAGNOSIS — R911 Solitary pulmonary nodule: Secondary | ICD-10-CM

## 2023-05-08 ENCOUNTER — Ambulatory Visit: Admission: RE | Admit: 2023-05-08 | Payer: 59 | Source: Ambulatory Visit

## 2023-05-14 ENCOUNTER — Ambulatory Visit
Admission: RE | Admit: 2023-05-14 | Discharge: 2023-05-14 | Disposition: A | Discharge status: Home / Self Care | Payer: 59 | Source: Ambulatory Visit | Attending: Family Medicine | Admitting: Family Medicine

## 2023-05-14 DIAGNOSIS — R911 Solitary pulmonary nodule: Secondary | ICD-10-CM | POA: Diagnosis present

## 2023-05-14 MED ORDER — IOHEXOL 300 MG/ML  SOLN
100.0000 mL | Freq: Once | INTRAMUSCULAR | Status: AC | PRN
  Administered 2023-05-14: 100 mL via INTRAVENOUS

## 2023-05-28 ENCOUNTER — Other Ambulatory Visit: Payer: Self-pay | Admitting: Family Medicine

## 2023-05-28 DIAGNOSIS — I2510 Atherosclerotic heart disease of native coronary artery without angina pectoris: Secondary | ICD-10-CM

## 2023-07-17 ENCOUNTER — Encounter
Admission: RE | Disposition: A | Discharge status: Home / Self Care | Payer: Self-pay | Source: Home / Self Care | Attending: Internal Medicine

## 2023-07-17 ENCOUNTER — Ambulatory Visit: Payer: 59 | Admitting: Anesthesiology

## 2023-07-17 ENCOUNTER — Ambulatory Visit
Admission: RE | Admit: 2023-07-17 | Discharge: 2023-07-17 | Disposition: A | Discharge status: Home / Self Care | Payer: 59 | Attending: Internal Medicine | Admitting: Internal Medicine

## 2023-07-17 ENCOUNTER — Encounter: Payer: Self-pay | Admitting: Internal Medicine

## 2023-07-17 DIAGNOSIS — Z1211 Encounter for screening for malignant neoplasm of colon: Secondary | ICD-10-CM | POA: Diagnosis present

## 2023-07-17 DIAGNOSIS — Z7984 Long term (current) use of oral hypoglycemic drugs: Secondary | ICD-10-CM | POA: Diagnosis not present

## 2023-07-17 DIAGNOSIS — J45909 Unspecified asthma, uncomplicated: Secondary | ICD-10-CM | POA: Insufficient documentation

## 2023-07-17 DIAGNOSIS — E119 Type 2 diabetes mellitus without complications: Secondary | ICD-10-CM | POA: Insufficient documentation

## 2023-07-17 DIAGNOSIS — I10 Essential (primary) hypertension: Secondary | ICD-10-CM | POA: Diagnosis not present

## 2023-07-17 DIAGNOSIS — K219 Gastro-esophageal reflux disease without esophagitis: Secondary | ICD-10-CM | POA: Insufficient documentation

## 2023-07-17 DIAGNOSIS — K641 Second degree hemorrhoids: Secondary | ICD-10-CM | POA: Diagnosis not present

## 2023-07-17 DIAGNOSIS — I2510 Atherosclerotic heart disease of native coronary artery without angina pectoris: Secondary | ICD-10-CM | POA: Insufficient documentation

## 2023-07-17 HISTORY — PX: COLONOSCOPY WITH PROPOFOL: SHX5780

## 2023-07-17 SURGERY — COLONOSCOPY WITH PROPOFOL
Anesthesia: General

## 2023-07-17 MED ORDER — PROPOFOL 500 MG/50ML IV EMUL
INTRAVENOUS | Status: DC | PRN
  Administered 2023-07-17: 150 ug/kg/min via INTRAVENOUS

## 2023-07-17 MED ORDER — PROPOFOL 10 MG/ML IV BOLUS
INTRAVENOUS | Status: AC
  Filled 2023-07-17: qty 20

## 2023-07-17 MED ORDER — SODIUM CHLORIDE 0.9 % IV SOLN: INTRAVENOUS | Status: DC

## 2023-07-17 MED ORDER — STERILE WATER FOR IRRIGATION IR SOLN
Status: DC | PRN
  Administered 2023-07-17: 50 mL

## 2023-07-17 NOTE — Anesthesia Postprocedure Evaluation (Signed)
Anesthesia Post Note  Patient: Kelly Rose  Procedure(s) Performed: COLONOSCOPY WITH PROPOFOL  Patient location during evaluation: PACU Anesthesia Type: General Level of consciousness: awake Pain management: pain level controlled Vital Signs Assessment: post-procedure vital signs reviewed and stable Respiratory status: spontaneous breathing Cardiovascular status: blood pressure returned to baseline Anesthetic complications: no   There were no known notable events for this encounter.   Last Vitals:  Vitals:   07/17/23 1111 07/17/23 1206  BP: (!) 142/67 (!) 94/43  Pulse: 73 71  Resp: 16 14  Temp: (!) 36 C   SpO2: 100% 100%    Last Pain:  Vitals:   07/17/23 1206  TempSrc:   PainSc: Asleep                 VAN STAVEREN,Kelly Rose

## 2023-07-17 NOTE — H&P (Signed)
Outpatient short stay form Pre-procedure 07/17/2023 11:33 AM Kelly Rose, M.D.  Primary Physician: Debbra Riding, PA-C  Reason for visit:  Colon cancer screening  History of present illness:  72 y/o female with history of CAD presents for colon cancer screening. Dr. Juliann Pares, cardiologist, reportedly cleared patient for colonoscopy as she had a positive cardiac stress test in the past. Patient denies change in bowel habits, rectal bleeding, weight loss or abdominal pain.      Current Facility-Administered Medications:    0.9 %  sodium chloride infusion, , Intravenous, Continuous, Amyia Lodwick, Boykin Nearing, MD  Medications Prior to Admission  Medication Sig Dispense Refill Last Dose   aspirin 81 MG tablet Take 81 mg by mouth daily.   07/16/2023   pantoprazole (PROTONIX) 40 MG tablet Take 40 mg by mouth daily as needed (acid reflux).   07/16/2023   simvastatin (ZOCOR) 20 MG tablet Take 1 tablet (20 mg total) by mouth at bedtime. 30 tablet 3 Past Week   benzonatate (TESSALON) 100 MG capsule Take 2 capsules (200 mg total) by mouth every 8 (eight) hours. (Patient not taking: Reported on 11/24/2021) 21 capsule 0    ipratropium (ATROVENT) 0.06 % nasal spray Place 2 sprays into both nostrils 4 (four) times daily. (Patient taking differently: Place 1 spray into both nostrils daily as needed for rhinitis (allergies).) 15 mL 12    losartan (COZAAR) 50 MG tablet Take 1 tablet (50 mg total) by mouth daily. (Patient not taking: Reported on 07/17/2023) 30 tablet 3 Not Taking   Melatonin 10 MG TABS Take 10 mg by mouth at bedtime as needed (sleep).      metoprolol succinate (TOPROL-XL) 25 MG 24 hr tablet Take 25 mg by mouth daily as needed (High Blood Pressure).      Multiple Vitamins-Minerals (MULTIVITAMIN WITH MINERALS) tablet Take 1 tablet by mouth daily. Centrum for Women      promethazine-dextromethorphan (PROMETHAZINE-DM) 6.25-15 MG/5ML syrup Take 5 mLs by mouth 4 (four) times daily as needed. (Patient  not taking: Reported on 11/24/2021) 118 mL 0    Semaglutide (RYBELSUS) 7 MG TABS Take 14 mg by mouth daily.   07/13/2023     Allergies  Allergen Reactions   Lisinopril Swelling and Palpitations   Metformin And Related Diarrhea     Past Medical History:  Diagnosis Date   Depression    Diabetes mellitus without complication (HCC)    Hypertension     Review of systems:  Otherwise negative.    Physical Exam  Gen: Alert, oriented. Appears stated age.  HEENT: [place]/AT. PERRLA. Lungs: CTA, no wheezes. CV: RR nl S1, S2. Abd: soft, benign, no masses. BS+ Ext: No edema. Pulses 2+    Planned procedures: Proceed with colonoscopy. The patient understands the nature of the planned procedure, indications, risks, alternatives and potential complications including but not limited to bleeding, infection, perforation, damage to internal organs and possible oversedation/side effects from anesthesia. The patient agrees and gives consent to proceed.  Please refer to procedure notes for findings, recommendations and patient disposition/instructions.     Brenlynn Fake K. Norma Rose, M.D. Gastroenterology 07/17/2023  11:33 AM

## 2023-07-17 NOTE — Anesthesia Preprocedure Evaluation (Signed)
Anesthesia Evaluation  Patient identified by MRN, date of birth, ID band Patient awake    Reviewed: Allergy & Precautions, NPO status , Patient's Chart, lab work & pertinent test results  Airway Mallampati: II  TM Distance: >3 FB Neck ROM: Full    Dental  (+) Edentulous Upper, Edentulous Lower   Pulmonary neg pulmonary ROS, asthma    Pulmonary exam normal  + decreased breath sounds      Cardiovascular Exercise Tolerance: Good hypertension, Pt. on medications negative cardio ROS Normal cardiovascular exam Rhythm:Regular Rate:Normal     Neuro/Psych    Depression    negative neurological ROS  negative psych ROS   GI/Hepatic negative GI ROS, Neg liver ROS,GERD  Medicated,,  Endo/Other  negative endocrine ROSWell Controlled, Type 2, Oral Hypoglycemic Agents    Renal/GU negative Renal ROS  negative genitourinary   Musculoskeletal   Abdominal  (+) + obese  Peds negative pediatric ROS (+)  Hematology negative hematology ROS (+)   Anesthesia Other Findings Past Medical History: No date: Depression No date: Diabetes mellitus without complication (HCC) No date: Hypertension  Past Surgical History: 1994: UMBILICAL HERNIA REPAIR  BMI    Body Mass Index: 29.60 kg/m      Reproductive/Obstetrics negative OB ROS                             Anesthesia Physical Anesthesia Plan  ASA: 3  Anesthesia Plan: General   Post-op Pain Management:    Induction: Intravenous  PONV Risk Score and Plan: Propofol infusion and TIVA  Airway Management Planned: Natural Airway and Nasal Cannula  Additional Equipment:   Intra-op Plan:   Post-operative Plan:   Informed Consent: I have reviewed the patients History and Physical, chart, labs and discussed the procedure including the risks, benefits and alternatives for the proposed anesthesia with the patient or authorized representative who has indicated  his/her understanding and acceptance.     Dental Advisory Given  Plan Discussed with: CRNA and Surgeon  Anesthesia Plan Comments:        Anesthesia Quick Evaluation

## 2023-07-17 NOTE — Op Note (Signed)
[facility] Patient Name: Kelly Rose Procedure Date: 07/17/2023 11:49 AM MRN: [id number] Account #: [id number] Date of Birth: 1950-12-26 Admit Type: Outpatient Age: 72 Room: [facility] ENDO ROOM 2 Gender: Female Note Status: Finalized Instrument Name: Nelda Marseille [id number] Procedure:             Colonoscopy Indications:           Screening for colorectal malignant neoplasm Providers:             Royce Macadamia K. Trayvon Trumbull MD, MD Medicines:             Propofol per Anesthesia Complications:         No immediate complications. Procedure:             Pre-Anesthesia Assessment:                        - The risks and benefits of the procedure and the                         sedation options and risks were discussed with the                         patient. All questions were answered and informed                         consent was obtained.                        - Patient identification and proposed procedure were                         verified prior to the procedure by the nurse. The                         procedure was verified in the procedure room.                        - ASA Grade Assessment: III - A patient with severe                         systemic disease.                        - After reviewing the risks and benefits, the patient                         was deemed in satisfactory condition to undergo the                         procedure.                        After obtaining informed consent, the colonoscope was                         passed under direct vision. Throughout the procedure,                         the patient's blood pressure, pulse, and oxygen  saturations were monitored continuously. The                         Colonoscope was introduced through the anus and                         advanced to the the cecum, identified by appendiceal                         orifice and ileocecal valve. The  colonoscopy was                         performed without difficulty. The patient tolerated                         the procedure well. The quality of the bowel                         preparation was adequate. The ileocecal valve,                         appendiceal orifice, and rectum were photographed. Findings:      The perianal and digital rectal examinations were normal. Pertinent       negatives include normal sphincter tone and no palpable rectal lesions.      Non-bleeding internal hemorrhoids were found during retroflexion. The       hemorrhoids were Grade II (internal hemorrhoids that prolapse but reduce       spontaneously).      The exam was otherwise without abnormality. Impression:            - Non-bleeding internal hemorrhoids.                        - The examination was otherwise normal.                        - No specimens collected. Recommendation:        - Patient has a contact number available for                         emergencies. The signs and symptoms of potential                         delayed complications were discussed with the patient.                         Return to normal activities tomorrow. Written                         discharge instructions were provided to the patient.                        - Resume previous diet.                        - Continue present medications.                        - You do NOT require further colon cancer screening  measures (Annual stool testing (i.e. hemoccult, FIT,                         cologuard), sigmoidoscopy, colonoscopy or CT                         colonography). You should share this recommendation                         with your Primary Care provider.                        - Return to GI office PRN.                        - The findings and recommendations were discussed with                         the patient. Procedure Code(s):     --- Professional ---                         A2130, Colorectal cancer screening; colonoscopy on                         individual not meeting criteria for high risk Diagnosis Code(s):     --- Professional ---                        K64.1, Second degree hemorrhoids                        Z12.11, Encounter for screening for malignant neoplasm                         of colon CPT copyright 2022 [employer]. All rights reserved. The codes documented in this report are preliminary and upon coder review may  be revised to meet current compliance requirements. Stanton Kidney MD, MD 07/17/2023 12:06:58 PM This report has been signed electronically. Number of Addenda: 0 Note Initiated On: 07/17/2023 11:49 AM Scope Withdrawal Time: 0 hours 6 minutes 36 seconds  Total Procedure Duration: 0 hours 10 minutes 18 seconds  Estimated Blood Loss:  Estimated blood loss: none.      [facility]

## 2023-07-17 NOTE — Interval H&P Note (Signed)
History and Physical Interval Note:  07/17/2023 11:36 AM  [facility]  has presented today for surgery, with the diagnosis of V76.51 (ICD-9-CM) - Z12.11 (ICD-10-CM) - Screening for colon cancer.  The various methods of treatment have been discussed with the patient and family. After consideration of risks, benefits and other options for treatment, the patient has consented to  Procedure(s) with comments: COLONOSCOPY WITH PROPOFOL (N/A) - Spanish interpreter as a surgical intervention.  The patient's history has been reviewed, patient examined, no change in status, stable for surgery.  I have reviewed the patient's chart and labs.  Questions were answered to the patient's satisfaction.     [place], [place]

## 2023-07-17 NOTE — Transfer of Care (Signed)
Immediate Anesthesia Transfer of Care Note  Patient: Kelly Rose  Procedure(s) Performed: COLONOSCOPY WITH PROPOFOL  Patient Location: PACU  Anesthesia Type:General  Level of Consciousness: awake and sedated  Airway & Oxygen Therapy: Patient Spontanous Breathing and Patient connected to nasal cannula oxygen  Post-op Assessment: Report given to RN and Post -op Vital signs reviewed and stable  Post vital signs: Reviewed and stable  Last Vitals:  Vitals Value Taken Time  BP    Temp    Pulse    Resp    SpO2      Last Pain:  Vitals:   07/17/23 1111  TempSrc: Temporal         Complications: There were no known notable events for this encounter.

## 2023-07-18 ENCOUNTER — Encounter: Payer: Self-pay | Admitting: Internal Medicine

## 2023-10-22 ENCOUNTER — Other Ambulatory Visit: Payer: Self-pay

## 2023-10-22 ENCOUNTER — Encounter: Payer: Self-pay | Admitting: Emergency Medicine

## 2023-10-22 ENCOUNTER — Emergency Department
Admission: EM | Admit: 2023-10-22 | Discharge: 2023-10-22 | Disposition: A | Discharge status: Home / Self Care | Payer: 59 | Attending: Emergency Medicine | Admitting: Emergency Medicine

## 2023-10-22 ENCOUNTER — Emergency Department: Payer: 59

## 2023-10-22 DIAGNOSIS — I10 Essential (primary) hypertension: Secondary | ICD-10-CM | POA: Insufficient documentation

## 2023-10-22 DIAGNOSIS — R55 Syncope and collapse: Secondary | ICD-10-CM

## 2023-10-22 DIAGNOSIS — E119 Type 2 diabetes mellitus without complications: Secondary | ICD-10-CM | POA: Insufficient documentation

## 2023-10-22 LAB — BASIC METABOLIC PANEL
Anion gap: 9 (ref 5–15)
BUN: 10 mg/dL (ref 8–23)
CO2: 27 mmol/L (ref 22–32)
Calcium: 9.3 mg/dL (ref 8.9–10.3)
Chloride: 103 mmol/L (ref 98–111)
Creatinine, Ser: 0.78 mg/dL (ref 0.44–1.00)
GFR, Estimated: >60 mL/min (ref >60)
Glucose, Bld: 130 mg/dL — ABNORMAL HIGH (ref 70–99)
Potassium: 3.6 mmol/L (ref 3.5–5.1)
Sodium: 139 mmol/L (ref 135–145)

## 2023-10-22 LAB — URINALYSIS, ROUTINE W REFLEX MICROSCOPIC
Bacteria, UA: NONE SEEN
Bilirubin Urine: NEGATIVE
Glucose, UA: NEGATIVE mg/dL
Ketones, ur: NEGATIVE mg/dL
Leukocytes,Ua: NEGATIVE
Nitrite: NEGATIVE
Protein, ur: NEGATIVE mg/dL
Specific Gravity, Urine: 1.011 (ref 1.005–1.030)
pH: 5 (ref 5.0–8.0)

## 2023-10-22 LAB — CBC
HCT: 39 % (ref 36.0–46.0)
Hemoglobin: 12.7 g/dL (ref 12.0–15.0)
MCH: 31.2 pg (ref 26.0–34.0)
MCHC: 32.6 g/dL (ref 30.0–36.0)
MCV: 95.8 fL (ref 80.0–100.0)
Platelets: 347 10*3/uL (ref 150–400)
RBC: 4.07 MIL/uL (ref 3.87–5.11)
RDW: 16 % — ABNORMAL HIGH (ref 11.5–15.5)
WBC: 7.5 10*3/uL (ref 4.0–10.5)
nRBC: 0.3 % — ABNORMAL HIGH (ref 0.0–0.2)

## 2023-10-22 LAB — TROPONIN I (HIGH SENSITIVITY)
Troponin I (High Sensitivity): 6 ng/L (ref <18)
Troponin I (High Sensitivity): 6 ng/L (ref <18)

## 2023-10-22 NOTE — ED Triage Notes (Signed)
Pt states that she went to the bathroom this am and felt like she was going to pass out so she laid herself down, pt reports that this has happened in the past and she was dehydrated, pt denies pain, states that she has been coughing  a lot with a runny nose

## 2023-10-22 NOTE — ED Provider Notes (Signed)
[facility] Provider Note    Event Date/Time   First MD Initiated Contact with Patient 10/22/23 1254     (approximate)   History   Chief Complaint: Near Syncope   HPI  [facility] Kelly Rose is a 73 y.o. female with a history of hypertension diabetes GERD who comes to the ED due to near syncope this morning.  She reports being in her normal health, got up and went to the bathroom.  When she stood back up to leave the bathroom, she got lightheaded and is worried that she might pass out so she leaned against the wall and slowly slid to the floor without injury.  Denies chest pain shortness of breath or palpitations.  Denies any pain at all.  Eating and drinking normally.  Reports an episode of syncope last year in the summertime which was attributed to dehydration.  At that time she felt better after receiving IV fluids.  Denies any recent vomiting diarrhea or dysuria.  She does endorse recent rhinorrhea and congestion and nonproductive cough.          Physical Exam   Triage Vital Signs: ED Triage Vitals  Encounter Vitals Group     BP 10/22/23 0951 (!) 130/94     Systolic BP Percentile --      Diastolic BP Percentile --      Pulse Rate 10/22/23 0951 85     Resp 10/22/23 0951 18     Temp 10/22/23 0951 98.2 F (36.8 C)     Temp Source 10/22/23 0951 Oral     SpO2 10/22/23 0951 100 %     Weight 10/22/23 1002 190 lb (86.2 kg)     Height 10/22/23 1002 5\' 7"  (1.702 m)     Head Circumference --      Peak Flow --      Pain Score 10/22/23 1002 0     Pain Loc --      Pain Education --      Exclude from Growth Chart --     Most recent vital signs: Vitals:   10/22/23 0951 10/22/23 1403  BP: (!) 130/94 132/88  Pulse: 85 80  Resp: 18 18  Temp: 98.2 F (36.8 C) 98 F (36.7 C)  SpO2: 100% 100%    General: Awake, no distress.  CV:  Good peripheral perfusion.  Regular rate rhythm Resp:  Normal effort.  Clear to auscultation  bilaterally Abd:  No distention.  Soft nontender Other:  Moist oral mucosa.  Cranial nerves III through XII intact.  Symmetric motor function, normal coordination.   ED Results / Procedures / Treatments   Labs (all labs ordered are listed, but only abnormal results are displayed) Labs Reviewed  BASIC METABOLIC PANEL - Abnormal; Notable for the following components:      Result Value   Glucose, Bld 130 (*)    All other components within normal limits  CBC - Abnormal; Notable for the following components:   RDW 16.0 (*)    nRBC 0.3 (*)    All other components within normal limits  URINALYSIS, ROUTINE W REFLEX MICROSCOPIC - Abnormal; Notable for the following components:   Color, Urine YELLOW (*)    APPearance CLEAR (*)    Hgb urine dipstick SMALL (*)    All other components within normal limits  TROPONIN I (HIGH SENSITIVITY)  TROPONIN I (HIGH SENSITIVITY)     EKG Interpreted by me Sinus rhythm rate of 80 with PACs versus less  likely sinus rhythm with Mobitz 2 block. Normal axis, normal ST segments and T waves.  No ischemic changes.   RADIOLOGY Chest x-ray interpreted by me, appears normal.  Radiology report reviewed   PROCEDURES:  Procedures   MEDICATIONS ORDERED IN ED: Medications - No data to display   IMPRESSION / MDM / ASSESSMENT AND PLAN / ED COURSE  I reviewed the triage vital signs and the nursing notes.  DDx: URI, AKI, electrolyte derangement, UTI, orthostatic dizziness, vagal episode, symptomatic Mobitz 2 heart block, NSTEMI  Patient's presentation is most consistent with acute presentation with potential threat to life or bodily function.  Patient presents with near syncope at home without acute cardiopulmonary symptoms.  No symptoms or concern for acute injury.  Vital signs unremarkable.  Exam reassuring.  Initial labs unremarkable.   Clinical Course as of 10/22/23 1452  Tue Oct 22, 2023  1341 EKG d/w cardiologist Dr. Juliann Pares - agrees EKG is sinus  with PACs. Awaiting trop and cxr [PS]    Clinical Course User Index [PS] Sharman Cheek, MD    ----------------------------------------- 2:52 PM on 10/22/2023 ----------------------------------------- Remains stable in the ED.  Awaiting repeat troponin, orthostatic vital signs.  If benign, patient will be stable for discharge for continued outpatient follow-up with cardiology.   FINAL CLINICAL IMPRESSION(S) / ED DIAGNOSES   Final diagnoses:  Near syncope     Rx / DC Orders   ED Discharge Orders     None        Note:  This document was prepared using Dragon voice recognition software and may include unintentional dictation errors.   Sharman Cheek, MD 10/22/23 1452

## 2023-10-22 NOTE — ED Provider Notes (Signed)
  Physical Exam  BP 132/88 (BP Location: Left Arm)   Pulse 80   Temp 98 F (36.7 C) (Oral)   Resp 18   Ht 5\' 7"  (1.702 m)   Wt 86.2 kg   SpO2 100%   BMI 29.76 kg/m   Physical Exam  Procedures  Procedures  ED Course / MDM   Clinical Course as of 10/22/23 1534  Tue Oct 22, 2023  1341 EKG d/w cardiologist Dr. Juliann Pares - agrees EKG is sinus with PACs. Awaiting trop and cxr [PS]    Clinical Course User Index [PS] Sharman Cheek, MD   Medical Decision Making Amount and/or Complexity of Data Reviewed Labs: ordered. Radiology: ordered.   Received patient in signout.  73 year old female presenting today for near syncopal episode that happened when she stood up in the bathroom.  No true syncopal episode.  Did not fall and injure anything.  Denies any other symptoms at this time and feels back to baseline.  Laboratory workup with initial provider was largely reassuring.  She was signed out pending repeat troponin and orthostatics.  Patient underwent orthostatics with no ongoing symptoms and no orthostatic hypotension.  Repeat troponin stable.  Patient safe for discharge and will follow-up with her outpatient primary care provider.  No red flag symptoms here today.     Janith Lima, MD 10/22/23 [id number]

## 2023-11-13 ENCOUNTER — Other Ambulatory Visit: Payer: Self-pay | Admitting: Family Medicine

## 2023-11-13 DIAGNOSIS — R222 Localized swelling, mass and lump, trunk: Secondary | ICD-10-CM

## 2023-11-20 ENCOUNTER — Ambulatory Visit
Admission: RE | Admit: 2023-11-20 | Discharge: 2023-11-20 | Disposition: A | Discharge status: Home / Self Care | Payer: 59 | Source: Ambulatory Visit | Attending: Family Medicine | Admitting: Family Medicine

## 2023-11-20 DIAGNOSIS — R222 Localized swelling, mass and lump, trunk: Secondary | ICD-10-CM | POA: Insufficient documentation

## 2023-11-26 ENCOUNTER — Other Ambulatory Visit: Payer: 59

## 2024-05-28 ENCOUNTER — Other Ambulatory Visit: Payer: Self-pay | Admitting: Family Medicine

## 2024-05-28 DIAGNOSIS — Z1231 Encounter for screening mammogram for malignant neoplasm of breast: Secondary | ICD-10-CM

## 2024-06-01 ENCOUNTER — Other Ambulatory Visit: Payer: Self-pay | Admitting: Family Medicine

## 2024-06-01 DIAGNOSIS — R202 Paresthesia of skin: Secondary | ICD-10-CM

## 2024-06-12 ENCOUNTER — Ambulatory Visit
Admission: RE | Admit: 2024-06-12 | Discharge: 2024-06-12 | Disposition: A | Discharge status: Home / Self Care | Source: Ambulatory Visit | Attending: Family Medicine | Admitting: Family Medicine

## 2024-06-12 DIAGNOSIS — R202 Paresthesia of skin: Secondary | ICD-10-CM | POA: Insufficient documentation

## 2024-06-29 ENCOUNTER — Ambulatory Visit
Admission: RE | Admit: 2024-06-29 | Discharge: 2024-06-29 | Disposition: A | Discharge status: Home / Self Care | Source: Ambulatory Visit | Attending: Family Medicine | Admitting: Family Medicine

## 2024-06-29 DIAGNOSIS — Z1231 Encounter for screening mammogram for malignant neoplasm of breast: Secondary | ICD-10-CM | POA: Insufficient documentation

## 2024-09-16 ENCOUNTER — Emergency Department: Admission: EM | Admit: 2024-09-16 | Discharge: 2024-09-16 | Disposition: A | Discharge status: Home / Self Care

## 2024-09-16 DIAGNOSIS — J069 Acute upper respiratory infection, unspecified: Secondary | ICD-10-CM | POA: Diagnosis not present

## 2024-09-16 DIAGNOSIS — E109 Type 1 diabetes mellitus without complications: Secondary | ICD-10-CM | POA: Diagnosis not present

## 2024-09-16 DIAGNOSIS — I10 Essential (primary) hypertension: Secondary | ICD-10-CM | POA: Insufficient documentation

## 2024-09-16 DIAGNOSIS — R519 Headache, unspecified: Secondary | ICD-10-CM | POA: Diagnosis present

## 2024-09-16 LAB — RESP PANEL BY RT-PCR (RSV, FLU A&B, COVID)  RVPGX2
Influenza A by PCR: NEGATIVE
Influenza B by PCR: NEGATIVE
Resp Syncytial Virus by PCR: NEGATIVE
SARS Coronavirus 2 by RT PCR: NEGATIVE

## 2024-09-16 NOTE — Discharge Instructions (Signed)
 Continue to monitor treat your symptoms with OTC medications as needed.  Follow-up with your primary provider for ongoing symptoms.  Rest and hydrate as necessary.

## 2024-09-16 NOTE — ED Provider Notes (Signed)
 "   [facility] Emergency Department Provider Note     Event Date/Time   First MD Initiated Contact with Patient 09/16/24 1708     (approximate)   History   Headache   HPI  Kelly Rose is a 73 y.o. female with a history of HTN, DM type I, GERD, and HLD, who presents to the ED via POV from home.  She endorses generalized weakness, headache, and nasal congestion.  She does endorse a flu positive contact in her home, as her granddaughter was recently diagnosed with influenza.  Denies any cough, fevers, shortness of breath, or GI symptoms.   Physical Exam   Triage Vital Signs: ED Triage Vitals  Encounter Vitals Group     BP 09/16/24 1655 (!) 154/69     Girls Systolic BP Percentile --      Girls Diastolic BP Percentile --      Boys Systolic BP Percentile --      Boys Diastolic BP Percentile --      Pulse Rate 09/16/24 1655 76     Resp 09/16/24 1655 18     Temp 09/16/24 1655 98.3 F (36.8 C)     Temp Source 09/16/24 1655 Oral     SpO2 09/16/24 1655 98 %     Weight 09/16/24 1656 220 lb (99.8 kg)     Height 09/16/24 1656 5' 7 (1.702 m)     Head Circumference --      Peak Flow --      Pain Score 09/16/24 1656 0     Pain Loc --      Pain Education --      Exclude from Growth Chart --     Most recent vital signs: Vitals:   09/16/24 1655  BP: (!) 154/69  Pulse: 76  Resp: 18  Temp: 98.3 F (36.8 C)  SpO2: 98%    General Awake, no distress.  NAD HEENT NCAT. PERRL. EOMI. No rhinorrhea. Mucous membranes are moist.  CV:  Good peripheral perfusion.  RRR RESP:  Normal effort.  CTA ABD:  No distention.  Soft and nontender   ED Results / Procedures / Treatments   Labs (all labs ordered are listed, but only abnormal results are displayed) Labs Reviewed  RESP PANEL BY RT-PCR (RSV, FLU A&B, COVID)  [id number]     EKG    RADIOLOGY  No results found.   PROCEDURES:  Critical Care performed: No  Procedures   MEDICATIONS  ORDERED IN ED: Medications - No data to display   IMPRESSION / MDM / ASSESSMENT AND PLAN / ED COURSE  I reviewed the triage vital signs and the nursing notes.                              Differential diagnosis includes, but is not limited to, COVID, flu, RSV, viral URI  Patient's presentation is most consistent with acute complicated illness / injury requiring diagnostic workup.  Patient's diagnosis is consistent with viral URI. Patient will be discharged home with instructions to take OTC medicines for symptom relief as needed. Patient is to follow up with her primary provider as needed or otherwise directed. Patient is given ED precautions to return to the ED for any worsening or new symptoms.   FINAL CLINICAL IMPRESSION(S) / ED DIAGNOSES   Final diagnoses:  Upper respiratory tract infection, unspecified type     Rx / DC Orders  ED Discharge Orders     None        Note:  This document was prepared using Dragon voice recognition software and may include unintentional dictation errors.    Loyd Candida LULLA Aldona, PA-C 09/16/24 1757    [place] Reche HERO, MD 09/16/24 1921  "

## 2024-09-16 NOTE — ED Triage Notes (Signed)
 Pt presents to the ED via POV from home with headache, generalized weakness, and nasal congestion. Pt has been exposed to her granddaughter who is getting over flu B. Pt denies cough, fever, SHOB, N/V/D. Pt A&Ox4. Ambulatory to triage.
